# Patient Record
Sex: Female | Born: 1995 | Race: Black or African American | Hispanic: No | Marital: Single | Smoking: Never smoker
Health system: Southern US, Community
[De-identification: ages and names within clinical notes are randomized; demographics above are authoritative.]

## PROBLEM LIST (undated history)

## (undated) DIAGNOSIS — E119 Type 2 diabetes mellitus without complications: Secondary | ICD-10-CM

## (undated) DIAGNOSIS — I10 Essential (primary) hypertension: Secondary | ICD-10-CM

## (undated) DIAGNOSIS — D649 Anemia, unspecified: Secondary | ICD-10-CM

## (undated) DIAGNOSIS — Z789 Other specified health status: Secondary | ICD-10-CM

## (undated) DIAGNOSIS — Z9289 Personal history of other medical treatment: Secondary | ICD-10-CM

## (undated) HISTORY — PX: NO PAST SURGERIES: SHX2092

---

## 2014-05-03 ENCOUNTER — Emergency Department (HOSPITAL_COMMUNITY)
Admission: EM | Admit: 2014-05-03 | Discharge: 2014-05-03 | Discharge status: Against Medical Advice | Payer: PRIVATE HEALTH INSURANCE | Attending: Emergency Medicine | Admitting: Emergency Medicine

## 2014-05-03 ENCOUNTER — Encounter (HOSPITAL_COMMUNITY): Payer: Self-pay | Admitting: Emergency Medicine

## 2014-05-03 ENCOUNTER — Emergency Department (HOSPITAL_COMMUNITY): Payer: PRIVATE HEALTH INSURANCE

## 2014-05-03 DIAGNOSIS — N390 Urinary tract infection, site not specified: Secondary | ICD-10-CM | POA: Diagnosis not present

## 2014-05-03 DIAGNOSIS — D6489 Other specified anemias: Secondary | ICD-10-CM | POA: Insufficient documentation

## 2014-05-03 DIAGNOSIS — R110 Nausea: Secondary | ICD-10-CM | POA: Insufficient documentation

## 2014-05-03 DIAGNOSIS — E669 Obesity, unspecified: Secondary | ICD-10-CM | POA: Diagnosis not present

## 2014-05-03 DIAGNOSIS — N898 Other specified noninflammatory disorders of vagina: Secondary | ICD-10-CM | POA: Diagnosis present

## 2014-05-03 DIAGNOSIS — Z3202 Encounter for pregnancy test, result negative: Secondary | ICD-10-CM | POA: Diagnosis not present

## 2014-05-03 DIAGNOSIS — N939 Abnormal uterine and vaginal bleeding, unspecified: Secondary | ICD-10-CM

## 2014-05-03 LAB — COMPREHENSIVE METABOLIC PANEL
ALT: 14 U/L (ref 0–35)
AST: 15 U/L (ref 0–37)
Albumin: 3.6 g/dL (ref 3.5–5.2)
Alkaline Phosphatase: 87 U/L (ref 39–117)
Anion gap: 12 (ref 5–15)
BILIRUBIN TOTAL: 0.2 mg/dL — AB (ref 0.3–1.2)
BUN: 9 mg/dL (ref 6–23)
CHLORIDE: 103 meq/L (ref 96–112)
CO2: 26 meq/L (ref 19–32)
CREATININE: 1.06 mg/dL (ref 0.50–1.10)
Calcium: 9.4 mg/dL (ref 8.4–10.5)
GFR, EST AFRICAN AMERICAN: 88 mL/min — AB (ref >90)
GFR, EST NON AFRICAN AMERICAN: 76 mL/min — AB (ref >90)
GLUCOSE: 123 mg/dL — AB (ref 70–99)
Potassium: 4.3 mEq/L (ref 3.7–5.3)
Sodium: 141 mEq/L (ref 137–147)
Total Protein: 7.5 g/dL (ref 6.0–8.3)

## 2014-05-03 LAB — URINE MICROSCOPIC-ADD ON

## 2014-05-03 LAB — WET PREP, GENITAL
Clue Cells Wet Prep HPF POC: NONE SEEN
Trich, Wet Prep: NONE SEEN
Yeast Wet Prep HPF POC: NONE SEEN

## 2014-05-03 LAB — CBC WITH DIFFERENTIAL/PLATELET
BASOS ABS: 0 10*3/uL (ref 0.0–0.1)
Basophils Relative: 0 % (ref 0–1)
Eosinophils Absolute: 0 10*3/uL (ref 0.0–0.7)
Eosinophils Relative: 0 % (ref 0–5)
HCT: 32.4 % — ABNORMAL LOW (ref 36.0–46.0)
HEMOGLOBIN: 10 g/dL — AB (ref 12.0–15.0)
LYMPHS PCT: 28 % (ref 12–46)
Lymphs Abs: 2.2 10*3/uL (ref 0.7–4.0)
MCH: 23.8 pg — ABNORMAL LOW (ref 26.0–34.0)
MCHC: 30.9 g/dL (ref 30.0–36.0)
MCV: 77.1 fL — ABNORMAL LOW (ref 78.0–100.0)
Monocytes Absolute: 0.5 10*3/uL (ref 0.1–1.0)
Monocytes Relative: 6 % (ref 3–12)
NEUTROS ABS: 5.1 10*3/uL (ref 1.7–7.7)
Neutrophils Relative %: 66 % (ref 43–77)
Platelets: 468 10*3/uL — ABNORMAL HIGH (ref 150–400)
RBC: 4.2 MIL/uL (ref 3.87–5.11)
RDW: 16.4 % — AB (ref 11.5–15.5)
WBC: 7.8 10*3/uL (ref 4.0–10.5)

## 2014-05-03 LAB — URINALYSIS, ROUTINE W REFLEX MICROSCOPIC
GLUCOSE, UA: NEGATIVE mg/dL
KETONES UR: 15 mg/dL — AB
Nitrite: POSITIVE — AB
PROTEIN: 100 mg/dL — AB
Specific Gravity, Urine: 1.024 (ref 1.005–1.030)
Urobilinogen, UA: 1 mg/dL (ref 0.0–1.0)
pH: 6 (ref 5.0–8.0)

## 2014-05-03 LAB — TYPE AND SCREEN
ABO/RH(D): O POS
Antibody Screen: NEGATIVE

## 2014-05-03 LAB — ABO/RH: ABO/RH(D): O POS

## 2014-05-03 LAB — POC URINE PREG, ED: Preg Test, Ur: NEGATIVE

## 2014-05-03 MED ORDER — CEPHALEXIN 500 MG PO CAPS: ORAL_CAPSULE | ORAL | Status: DC

## 2014-05-03 MED ORDER — DEXTROSE 5 % IV SOLN
1.0000 g | Freq: Once | INTRAVENOUS | Status: AC
  Administered 2014-05-03: 1 g via INTRAVENOUS
  Filled 2014-05-03: qty 10

## 2014-05-03 MED ORDER — ONDANSETRON HCL 4 MG/2ML IJ SOLN: 4.0000 mg | Freq: Once | INTRAMUSCULAR | Status: DC

## 2014-05-03 MED ORDER — HYDROMORPHONE HCL PF 1 MG/ML IJ SOLN: 1.0000 mg | Freq: Once | INTRAMUSCULAR | Status: DC

## 2014-05-03 NOTE — Discharge Instructions (Signed)
Today you left against medical advice and did not receive the pelvic ultrasound. This would rule out dangerous conditions such as ovarian torsion and give [place] a better look at your pelvic organs. Please return to the Emergency Department or to [facility] if your symptoms worsen or you change your mind. Please return to the ED if you become lightheaded, short of breath, or develop any symptom that is concerning to you. These could be signs that your blood count is too low.    Urinary Tract Infection A urinary tract infection (UTI) can occur any place along the urinary tract. The tract includes the kidneys, ureters, bladder, and urethra. A type of germ called bacteria often causes a UTI. UTIs are often helped with antibiotic medicine.  HOME CARE   If given, take antibiotics as told by your doctor. Finish them even if you start to feel better.  Drink enough fluids to keep your pee (urine) clear or pale yellow.  Avoid tea, drinks with caffeine, and bubbly (carbonated) drinks.  Pee often. Avoid holding your pee in for a long time.  Pee before and after having sex (intercourse).  Wipe from front to back after you poop (bowel movement) if you are a woman. Use each tissue only once. GET HELP RIGHT AWAY IF:   You have back pain.  You have lower belly (abdominal) pain.  You have chills.  You feel sick to your stomach (nauseous).  You throw up (vomit).  Your burning or discomfort with peeing does not go away.  You have a fever.  Your symptoms are not better in 3 days. MAKE SURE YOU:   Understand these instructions.  Will watch your condition.  Will get help right away if you are not doing well or get worse. Document Released: 02/03/2008 Document Revised: 05/11/2012 Document Reviewed: 03/17/2012 [facility] Patient Information 2015 [place], [place]. This information is not intended to replace advice given to you by your health care provider. Make sure you discuss any questions you  have with your health care provider.

## 2014-05-03 NOTE — ED Notes (Signed)
Pt c/o vaginal bleeding x 3 weeks; pt sts hx of anemia; pt sts hx of blood transfusion; pt c/o generalized weakness; pt appears pale

## 2014-05-03 NOTE — ED Provider Notes (Signed)
CSN: [id number]     Arrival date & time 05/03/14  1452 History   First MD Initiated Contact with Patient 05/03/14 1729     Chief Complaint  Patient presents with  . Vaginal Bleeding     (Consider location/radiation/quality/duration/timing/severity/associated sxs/prior Treatment) HPI Comments: Patient is a 18 year old female who present sto the ED today for evaluation of vaginal bleeding. She reports that her vaginal bleeding began 3 weeks ago. She has had heavy vaginal bleeding, passing clots every day. She has associated nausea when she is eating. She is not on any form of birth control or estrogen. She additionally has generalized weakness. She reports that this happened in the past, many years ago and she required a blood transfusion. No vomiting, diarrhea, lightheadedness, dizziness, shortness of breath, chest pain. She denies any urinary symptoms including dysuria, urinary urgency, urinary frequency.  Patient is a 18 y.o. female presenting with vaginal bleeding. The history is provided by the patient. No language interpreter was used.  Vaginal Bleeding Associated symptoms: nausea   Associated symptoms: no abdominal pain and no fever     History reviewed. No pertinent past medical history. History reviewed. No pertinent past surgical history. History reviewed. No pertinent family history. History  Substance Use Topics  . Smoking status: Never Smoker   . Smokeless tobacco: Not on file  . Alcohol Use: Yes     Comment: occ   OB History   Grav Para Term Preterm Abortions TAB SAB Ect Mult Living                 Review of Systems  Constitutional: Negative for fever and chills.  Respiratory: Negative for shortness of breath.   Cardiovascular: Negative for chest pain.  Gastrointestinal: Positive for nausea. Negative for vomiting and abdominal pain.  Genitourinary: Positive for vaginal bleeding and pelvic pain.  Neurological: Positive for weakness (generalized) and light-headedness.   All other systems reviewed and are negative.     Allergies  Review of patient's allergies indicates no known allergies.  Home Medications   Prior to Admission medications   Not on File   BP 126/56  Pulse 90  Temp(Src) 97.8 F (36.6 C) (Oral)  Resp 16  SpO2 100% Physical Exam  Nursing note and vitals reviewed. Constitutional: She is oriented to person, place, and time. She appears well-developed and well-nourished. No distress.  Obese No acute distress  HENT:  Head: Normocephalic and atraumatic.  Right Ear: External ear normal.  Left Ear: External ear normal.  Nose: Nose normal.  Mouth/Throat: Oropharynx is clear and moist.  Eyes: Conjunctivae are normal.  Neck: Normal range of motion.  Cardiovascular: Normal rate, regular rhythm and normal heart sounds.   Pulmonary/Chest: Effort normal and breath sounds normal. No stridor. No respiratory distress. She has no wheezes. She has no rales.  Abdominal: Soft. She exhibits no distension. There is tenderness in the suprapubic area. There is no rigidity, no rebound and no guarding.  Genitourinary: There is no rash, tenderness or lesion on the right labia. There is no rash, tenderness or lesion on the left labia. Uterus is tender. Cervix exhibits discharge (bleeding). Cervix exhibits no motion tenderness. Right adnexum displays no mass, no tenderness and no fullness. Left adnexum displays no mass, no tenderness and no fullness. There is tenderness around the vagina. No erythema around the vagina. No foreign body around the vagina. No signs of injury around the vagina. No vaginal discharge found.  Bleeding coming from the cervical os. Patient is  very tender on pelvic exam.  Musculoskeletal: Normal range of motion.  Neurological: She is alert and oriented to person, place, and time. She has normal strength.  Skin: Skin is warm and dry. She is not diaphoretic. No erythema.  Psychiatric: She has a normal mood and affect. Her behavior is  normal.    ED Course  Procedures (including critical care time) Labs Review Labs Reviewed  WET PREP, GENITAL - Abnormal; Notable for the following:    WBC, Wet Prep HPF POC FEW (*)    All other components within normal limits  CBC WITH DIFFERENTIAL - Abnormal; Notable for the following:    Hemoglobin 10.0 (*)    HCT 32.4 (*)    MCV 77.1 (*)    MCH 23.8 (*)    RDW 16.4 (*)    Platelets 468 (*)    All other components within normal limits  COMPREHENSIVE METABOLIC PANEL - Abnormal; Notable for the following:    Glucose, Bld 123 (*)    Total Bilirubin 0.2 (*)    GFR calc non Af Amer 76 (*)    GFR calc Af Amer 88 (*)    All other components within normal limits  URINALYSIS, ROUTINE W REFLEX MICROSCOPIC - Abnormal; Notable for the following:    Color, Urine RED (*)    APPearance TURBID (*)    Hgb urine dipstick LARGE (*)    Bilirubin Urine MODERATE (*)    Ketones, ur 15 (*)    Protein, ur 100 (*)    Nitrite POSITIVE (*)    Leukocytes, UA MODERATE (*)    All other components within normal limits  URINE MICROSCOPIC-ADD ON - Abnormal; Notable for the following:    Casts HYALINE CASTS (*)    All other components within normal limits  GC/CHLAMYDIA PROBE AMP  HIV ANTIBODY (ROUTINE TESTING)  POC URINE PREG, ED  TYPE AND SCREEN  ABO/RH    Imaging Review No results found.   EKG Interpretation None      MDM   Final diagnoses:  Vaginal bleeding  Anemia due to other cause  UTI (lower urinary tract infection)    Patient presents to the emergency department for evaluation of 3 weeks of vaginal bleeding. UA shows infection. Treated in ED with rocephin and given keflex for home. There is blood coming from cervical os on pelvic exam. She is very tender to palpation. Pelvic ultrasound was ordered. The patient initially was receptive to this ultrasound, but then stated that she is unwilling to get the ultrasound because she did not think it would be covered by her insurance. I  explained to the patient that it was very likely that this would in fact be covered by her insurance and the risk of not getting the ultrasound would be missing a life-threatening diagnosis, losing an ovary, permanent morbidity. Patient expresses understanding and still chooses to sign out AGAINST MEDICAL ADVICE. She was encouraged to followup at [facility] tomorrow. Discussed reasons to return to the emergency department immediately. Patient is hemodynamically stable at time of departure. Discussed case with Dr. Manus Gunning who agrees with plan.     Mora Bellman, PA-C 05/04/14 [phone]

## 2014-05-04 LAB — HIV ANTIBODY (ROUTINE TESTING W REFLEX): HIV 1&2 Ab, 4th Generation: NONREACTIVE

## 2014-05-04 LAB — GC/CHLAMYDIA PROBE AMP
CT Probe RNA: NEGATIVE
GC PROBE AMP APTIMA: NEGATIVE

## 2014-05-04 NOTE — ED Provider Notes (Signed)
Medical screening examination/treatment/procedure(s) were performed by non-physician practitioner and as supervising physician I was immediately available for consultation/collaboration. BP 126/56  Pulse 90  Temp(Src) 97.8 F (36.6 C) (Oral)  Resp 16  SpO2 100%   EKG Interpretation None       Glynn Octave, MD 05/04/14 0144

## 2014-06-04 ENCOUNTER — Encounter (HOSPITAL_COMMUNITY): Payer: Self-pay | Admitting: Emergency Medicine

## 2014-06-04 ENCOUNTER — Observation Stay (HOSPITAL_COMMUNITY): Payer: No Typology Code available for payment source

## 2014-06-04 ENCOUNTER — Observation Stay (HOSPITAL_COMMUNITY)
Admission: EM | Admit: 2014-06-04 | Discharge: 2014-06-05 | Disposition: A | Discharge status: Other Acute Inpatient Hospital | Payer: No Typology Code available for payment source | Attending: Obstetrics & Gynecology | Admitting: Obstetrics & Gynecology

## 2014-06-04 DIAGNOSIS — N920 Excessive and frequent menstruation with regular cycle: Secondary | ICD-10-CM | POA: Diagnosis present

## 2014-06-04 DIAGNOSIS — D508 Other iron deficiency anemias: Secondary | ICD-10-CM

## 2014-06-04 DIAGNOSIS — N939 Abnormal uterine and vaginal bleeding, unspecified: Secondary | ICD-10-CM | POA: Diagnosis not present

## 2014-06-04 DIAGNOSIS — Z9289 Personal history of other medical treatment: Secondary | ICD-10-CM | POA: Diagnosis not present

## 2014-06-04 DIAGNOSIS — D649 Anemia, unspecified: Secondary | ICD-10-CM | POA: Diagnosis present

## 2014-06-04 DIAGNOSIS — N921 Excessive and frequent menstruation with irregular cycle: Secondary | ICD-10-CM

## 2014-06-04 HISTORY — DX: Anemia, unspecified: D64.9

## 2014-06-04 HISTORY — DX: Personal history of other medical treatment: Z92.89

## 2014-06-04 HISTORY — DX: Other specified health status: Z78.9

## 2014-06-04 LAB — URINALYSIS, ROUTINE W REFLEX MICROSCOPIC
Bilirubin Urine: NEGATIVE
Glucose, UA: NEGATIVE mg/dL
Ketones, ur: NEGATIVE mg/dL
NITRITE: NEGATIVE
Protein, ur: 100 mg/dL — AB
Specific Gravity, Urine: 1.034 — ABNORMAL HIGH (ref 1.005–1.030)
UROBILINOGEN UA: 1 mg/dL (ref 0.0–1.0)
pH: 6 (ref 5.0–8.0)

## 2014-06-04 LAB — BASIC METABOLIC PANEL
ANION GAP: 13 (ref 5–15)
BUN: 12 mg/dL (ref 6–23)
CO2: 24 mEq/L (ref 19–32)
CREATININE: 0.94 mg/dL (ref 0.50–1.10)
Calcium: 9.6 mg/dL (ref 8.4–10.5)
Chloride: 97 mEq/L (ref 96–112)
GFR calc non Af Amer: 87 mL/min — ABNORMAL LOW (ref >90)
Glucose, Bld: 102 mg/dL — ABNORMAL HIGH (ref 70–99)
POTASSIUM: 3.8 meq/L (ref 3.7–5.3)
Sodium: 134 mEq/L — ABNORMAL LOW (ref 137–147)

## 2014-06-04 LAB — RETICULOCYTES
RBC.: 3.49 MIL/uL — ABNORMAL LOW (ref 3.87–5.11)
Retic Count, Absolute: 111.7 10*3/uL (ref 19.0–186.0)
Retic Ct Pct: 3.2 % — ABNORMAL HIGH (ref 0.4–3.1)

## 2014-06-04 LAB — CBC
HCT: 25.4 % — ABNORMAL LOW (ref 36.0–46.0)
Hemoglobin: 7.2 g/dL — ABNORMAL LOW (ref 12.0–15.0)
MCH: 20.8 pg — ABNORMAL LOW (ref 26.0–34.0)
MCHC: 28.3 g/dL — ABNORMAL LOW (ref 30.0–36.0)
MCV: 73.4 fL — AB (ref 78.0–100.0)
PLATELETS: 338 10*3/uL (ref 150–400)
RBC: 3.46 MIL/uL — ABNORMAL LOW (ref 3.87–5.11)
RDW: 16.8 % — AB (ref 11.5–15.5)
WBC: 7.7 10*3/uL (ref 4.0–10.5)

## 2014-06-04 LAB — PREPARE RBC (CROSSMATCH)

## 2014-06-04 LAB — URINE MICROSCOPIC-ADD ON

## 2014-06-04 LAB — TSH: TSH: 2.58 u[IU]/mL (ref 0.350–4.500)

## 2014-06-04 LAB — PREGNANCY, URINE: Preg Test, Ur: NEGATIVE

## 2014-06-04 LAB — ABO/RH: ABO/RH(D): O POS

## 2014-06-04 MED ORDER — NORETHINDRONE-ETH ESTRADIOL 0.4-35 MG-MCG PO TABS
1.0000 | ORAL_TABLET | Freq: Three times a day (TID) | ORAL | Status: DC
  Filled 2014-06-04 (×3): qty 1

## 2014-06-04 MED ORDER — LACTATED RINGERS IV SOLN: INTRAVENOUS | Status: DC

## 2014-06-04 MED ORDER — ONDANSETRON HCL 4 MG/2ML IJ SOLN: 4.0000 mg | Freq: Three times a day (TID) | INTRAMUSCULAR | Status: AC | PRN

## 2014-06-04 MED ORDER — SODIUM CHLORIDE 0.9 % IV SOLN: Freq: Once | INTRAVENOUS | Status: DC

## 2014-06-04 MED ORDER — IBUPROFEN 600 MG PO TABS: 600.0000 mg | ORAL_TABLET | Freq: Four times a day (QID) | ORAL | Status: DC | PRN

## 2014-06-04 MED ORDER — SIMETHICONE 80 MG PO CHEW
80.0000 mg | CHEWABLE_TABLET | Freq: Four times a day (QID) | ORAL | Status: DC | PRN
  Filled 2014-06-04: qty 1

## 2014-06-04 MED ORDER — TRAMADOL HCL 50 MG PO TABS: 50.0000 mg | ORAL_TABLET | Freq: Four times a day (QID) | ORAL | Status: DC | PRN

## 2014-06-04 NOTE — ED Notes (Signed)
[facility] called for transport. 

## 2014-06-04 NOTE — ED Notes (Signed)
Pt c/o headache and nausea since Friday, has vomited twice since then .

## 2014-06-04 NOTE — ED Notes (Signed)
Despite being educated about the risks of continued bleeding without discovering the source, patient refuses to have a pelvic exam that has been ordered due to her sister's wishes.

## 2014-06-04 NOTE — ED Notes (Signed)
In to start second IV line-patient on cell phone and talking with visitors-did not acknowledge this writer-informed visitors to let me know when patient is off her cell phone so that we may continue her care

## 2014-06-04 NOTE — ED Provider Notes (Signed)
CSN: [id number]     Arrival date & time 06/04/14  1450 History   First MD Initiated Contact with Patient 06/04/14 1756     Chief Complaint  Patient presents with  . Headache  . Nausea     (Consider location/radiation/quality/duration/timing/severity/associated sxs/prior Treatment) HPI Comments: The patient is a 18 year old female with a past medical history of anemia presents emergency room chief complaint of persistent nausea for 4 days. The patient reports onset after eating fish 40s ago, reports 3 episodes of nonbloody emesis. Denies abdominal pain. Patient reports last menstrual period of onset August 2nd.  She reports persistent vaginal bleeding for 2 months. She reports decrease over the last one week, using 3-4 pads per day. Patient reports having to transfuse in the past for a hemoglobin of 4. Reports compliance with iron supplements PCP in [place]  The history is provided by the patient. No language interpreter was used.    History reviewed. No pertinent past medical history. History reviewed. No pertinent past surgical history. No family history on file. History  Substance Use Topics  . Smoking status: Never Smoker   . Smokeless tobacco: Not on file  . Alcohol Use: Yes     Comment: occ   OB History   Grav Para Term Preterm Abortions TAB SAB Ect Mult Living                 Review of Systems  Constitutional: Negative for fever and chills.  Gastrointestinal: Positive for nausea and vomiting. Negative for abdominal pain, diarrhea, constipation and blood in stool.  Genitourinary: Positive for vaginal bleeding.  Neurological: Positive for weakness and headaches. Negative for dizziness, syncope and light-headedness.      Allergies  Shrimp  Home Medications   Prior to Admission medications   Medication Sig Start Date End Date Taking? Authorizing Provider  IRON PO Take 1 tablet by mouth daily.   Yes Historical Provider, MD  albuterol (PROVENTIL  HFA;VENTOLIN HFA) 108 (90 BASE) MCG/ACT inhaler Inhale 2 puffs into the lungs every 6 (six) hours as needed for wheezing or shortness of breath.    Historical Provider, MD   BP 148/82  Pulse 100  Temp(Src) 97.8 F (36.6 C) (Oral)  Resp 16  SpO2 100%  LMP 04/01/2014 Physical Exam  Nursing note and vitals reviewed. Constitutional: She is oriented to person, place, and time. She appears well-developed and well-nourished.  Non-toxic appearance. She does not have a sickly appearance. She does not appear ill. No distress.  HENT:  Head: Normocephalic and atraumatic.  Eyes: EOM are normal. Pupils are equal, round, and reactive to light.  Pale conjuntiva  Neck: Neck supple.  Cardiovascular: Normal rate and regular rhythm.   Pulmonary/Chest: Effort normal and breath sounds normal. She has no wheezes. She has no rales.  Abdominal: Soft. There is no tenderness. There is no rebound.  Musculoskeletal: Normal range of motion.  Neurological: She is alert and oriented to person, place, and time.  Skin: Skin is warm and dry. She is not diaphoretic. No pallor.  Psychiatric: She has a normal mood and affect. Her behavior is normal.    ED Course  Procedures (including critical care time) Labs Review Results for orders placed during the hospital encounter of 06/04/14  CBC      Result Value Ref Range   WBC 7.7  4.0 - 10.5 K/uL   RBC 3.46 (*) 3.87 - 5.11 MIL/uL   Hemoglobin 7.2 (*) 12.0 - 15.0 g/dL   HCT 57.825.4 (*)  36.0 - 46.0 %   MCV 73.4 (*) 78.0 - 100.0 fL   MCH 20.8 (*) 26.0 - 34.0 pg   MCHC 28.3 (*) 30.0 - 36.0 g/dL   RDW 16.1 (*) 09.6 - 04.5 %   Platelets 338  150 - 400 K/uL  BASIC METABOLIC PANEL      Result Value Ref Range   Sodium 134 (*) 137 - 147 mEq/L   Potassium 3.8  3.7 - 5.3 mEq/L   Chloride 97  96 - 112 mEq/L   CO2 24  19 - 32 mEq/L   Glucose, Bld 102 (*) 70 - 99 mg/dL   BUN 12  6 - 23 mg/dL   Creatinine, Ser 4.09  0.50 - 1.10 mg/dL   Calcium 9.6  8.4 - 81.1 mg/dL   GFR calc  non Af Amer 87 (*) >90 mL/min   GFR calc Af Amer >90  >90 mL/min   Anion gap 13  5 - 15  URINALYSIS, ROUTINE W REFLEX MICROSCOPIC      Result Value Ref Range   Color, Urine AMBER (*) YELLOW   APPearance CLOUDY (*) CLEAR   Specific Gravity, Urine 1.034 (*) 1.005 - 1.030   pH 6.0  5.0 - 8.0   Glucose, UA NEGATIVE  NEGATIVE mg/dL   Hgb urine dipstick LARGE (*) NEGATIVE   Bilirubin Urine NEGATIVE  NEGATIVE   Ketones, ur NEGATIVE  NEGATIVE mg/dL   Protein, ur 914 (*) NEGATIVE mg/dL   Urobilinogen, UA 1.0  0.0 - 1.0 mg/dL   Nitrite NEGATIVE  NEGATIVE   Leukocytes, UA TRACE (*) NEGATIVE  PREGNANCY, URINE      Result Value Ref Range   Preg Test, Ur NEGATIVE  NEGATIVE  URINE MICROSCOPIC-ADD ON      Result Value Ref Range   Squamous Epithelial / LPF FEW (*) RARE   WBC, UA 3-6  <3 WBC/hpf   RBC / HPF 3-6  <3 RBC/hpf   Bacteria, UA MANY (*) RARE   Urine-Other MUCOUS PRESENT    RETICULOCYTES      Result Value Ref Range   Retic Ct Pct 3.2 (*) 0.4 - 3.1 %   RBC. 3.49 (*) 3.87 - 5.11 MIL/uL   Retic Count, Manual 111.7  19.0 - 186.0 K/uL  TSH      Result Value Ref Range   TSH 2.580  0.350 - 4.500 uIU/mL  TYPE AND SCREEN      Result Value Ref Range   ABO/RH(D) O POS     Antibody Screen NEG     Sample Expiration 06/07/2014     Unit Number [id number]     Blood Component Type RED CELLS,LR     Unit division 00     Status of Unit ALLOCATED     Transfusion Status OK TO TRANSFUSE     Crossmatch Result Compatible     Unit Number [id number]     Blood Component Type RBC LR PHER2     Unit division 00     Status of Unit ISSUED     Transfusion Status OK TO TRANSFUSE     Crossmatch Result Compatible    ABO/RH      Result Value Ref Range   ABO/RH(D) O POS    PREPARE RBC (CROSSMATCH)      Result Value Ref Range   Order Confirmation ORDER PROCESSED BY BLOOD BANK     No results found.  Imaging Review No results found.   EKG Interpretation None  MDM   Final diagnoses:   Other iron deficiency anemias  Menorrhagia with irregular cycle   Patient presents with persistent vaginal bleeding since August 2, multiple pads per day. Hemoglobin 7.2 today, down trending from 10 4 weeks ago. Nausea, vomiting, headache likely due to anemia. Plan to transfuse and admit. Positive orthostatics. Discussed with Penne Lash who advises, Pelvic US, ortho tri-cyclen, pt does not have history or family history of PE/DVT. Discussed pelvic exam and plan to admit to women's health.  Pt has positive ortho statics. Patient declined pelvic exam, pelvic ultrasound. Plant to admit to Dr. Penne Lash service.  Meds given in ED:  Medications  0.9 %  sodium chloride infusion (not administered)  simethicone (MYLICON) chewable tablet 80 mg (not administered)  lactated ringers infusion (not administered)  traMADol (ULTRAM) tablet 50 mg (not administered)  ibuprofen (ADVIL,MOTRIN) tablet 600 mg (not administered)  norethindrone-ethinyl estradiol (OVCON-35,BALZIVA,BRIELLYN) tablet 1 tablet (not administered)    New Prescriptions   No medications on file    Mellody Drown, PA-C 06/05/14 0210

## 2014-06-04 NOTE — ED Notes (Signed)
Pt is refusing the pelvic exam and the ultrasounds at this time. Dr. Littie DeedsGentry is at bedside speaking with patient at this time.

## 2014-06-04 NOTE — ED Notes (Signed)
PA at bedside.

## 2014-06-05 ENCOUNTER — Inpatient Hospital Stay (HOSPITAL_COMMUNITY): Payer: No Typology Code available for payment source

## 2014-06-05 ENCOUNTER — Encounter (HOSPITAL_COMMUNITY): Payer: Self-pay | Admitting: *Deleted

## 2014-06-05 DIAGNOSIS — Z9289 Personal history of other medical treatment: Secondary | ICD-10-CM

## 2014-06-05 DIAGNOSIS — N939 Abnormal uterine and vaginal bleeding, unspecified: Secondary | ICD-10-CM

## 2014-06-05 DIAGNOSIS — D649 Anemia, unspecified: Secondary | ICD-10-CM

## 2014-06-05 LAB — CBC
HCT: 25.5 % — ABNORMAL LOW (ref 36.0–46.0)
HEMOGLOBIN: 7.7 g/dL — AB (ref 12.0–15.0)
MCH: 22.6 pg — ABNORMAL LOW (ref 26.0–34.0)
MCHC: 30.2 g/dL (ref 30.0–36.0)
MCV: 75 fL — ABNORMAL LOW (ref 78.0–100.0)
PLATELETS: 277 10*3/uL (ref 150–400)
RBC: 3.4 MIL/uL — ABNORMAL LOW (ref 3.87–5.11)
RDW: 18.4 % — ABNORMAL HIGH (ref 11.5–15.5)
WBC: 8.3 10*3/uL (ref 4.0–10.5)

## 2014-06-05 LAB — IRON AND TIBC
Iron: <10 ug/dL — ABNORMAL LOW (ref 42–135)
UIBC: 479 ug/dL — ABNORMAL HIGH (ref 125–400)

## 2014-06-05 LAB — PREPARE RBC (CROSSMATCH)

## 2014-06-05 LAB — FERRITIN: Ferritin: 2 ng/mL — ABNORMAL LOW (ref 10–291)

## 2014-06-05 LAB — VITAMIN B12: Vitamin B-12: 350 pg/mL (ref 211–911)

## 2014-06-05 LAB — FOLATE: FOLATE: 8.3 ng/mL

## 2014-06-05 LAB — ABO/RH: ABO/RH(D): O POS

## 2014-06-05 MED ORDER — FERROUS SULFATE 325 (65 FE) MG PO TABS: 325.0000 mg | ORAL_TABLET | Freq: Three times a day (TID) | ORAL | Status: DC

## 2014-06-05 MED ORDER — SODIUM CHLORIDE 0.9 % IV SOLN
Freq: Once | INTRAVENOUS | Status: AC
  Administered 2014-06-05: 10 mL/h via INTRAVENOUS

## 2014-06-05 MED ORDER — NORETHIN-ETH ESTRADIOL-FE 0.4-35 MG-MCG PO CHEW: 1.0000 | CHEWABLE_TABLET | Freq: Every day | ORAL | Status: DC

## 2014-06-05 MED ORDER — NORETHIN-ETH ESTRADIOL-FE 0.4-35 MG-MCG PO CHEW
1.0000 | CHEWABLE_TABLET | Freq: Three times a day (TID) | ORAL | Status: DC
  Administered 2014-06-05 (×3): 1 via ORAL
  Filled 2014-06-05 (×5): qty 1

## 2014-06-05 MED ORDER — FERROUS SULFATE 325 (65 FE) MG PO TABS
325.0000 mg | ORAL_TABLET | Freq: Three times a day (TID) | ORAL | Status: DC
  Administered 2014-06-05 (×3): 325 mg via ORAL
  Filled 2014-06-05 (×3): qty 1

## 2014-06-05 NOTE — Discharge Summary (Signed)
Physician Discharge Summary  Patient ID: Rebecca Coleman MRN: [id number] DOB/AGE: [birth year] 18 y.o.  Admit date: 06/04/2014 Discharge date: 06/05/2014  Admission Diagnoses:menorrhagia and anemia  Discharge Diagnoses: same Active Problems:   Anemia   Menorrhagia   Discharged Condition: good  Hospital Course: 18 y.o. G0P0 Patient's last menstrual period was 04/01/2014. Patient presented with prolonged vaginal bleeding and symptoms of anemia. She has a history of DUB  Past Medical History  Diagnosis Date  . Anemia   . History of blood transfusion   . Medical history non-contributory    Past Surgical History  Procedure Laterality Date  . No past surgeries     Allergies  Allergen Reactions  . Shrimp [Shellfish Allergy] Other (See Comments)    Itchy throat     Consults: None  Significant Diagnostic Studies: labs:  CBC    Component Value Date/Time   WBC 7.9 06/05/2014 1527   RBC 4.60 06/05/2014 1527   RBC 3.49* 06/04/2014 1851   HGB 11.0* 06/05/2014 1527   HCT 35.3* 06/05/2014 1527   PLT 252 06/05/2014 1527   MCV 76.7* 06/05/2014 1527   MCH 23.9* 06/05/2014 1527   MCHC 31.2 06/05/2014 1527   RDW 17.7* 06/05/2014 1527   LYMPHSABS 2.2 05/03/2014 1512   MONOABS 0.5 05/03/2014 1512   EOSABS 0.0 05/03/2014 1512   BASOSABS 0.0 05/03/2014 1512      Treatments: transfusion 3 units PRBC, IV fluids, OCP  Discharge Exam: Blood pressure 121/72, pulse 88, temperature 99.5 F (37.5 C), temperature source Oral, resp. rate 18, height 6' (1.829 m), weight 119.296 kg (263 lb), last menstrual period 04/01/2014, SpO2 100.00%. General appearance: alert, cooperative and no distress GI: soft, non-tender; bowel sounds normal; no masses,  no organomegaly  Disposition: discharge home    Medication List    TAKE these medications       albuterol 108 (90 BASE) MCG/ACT inhaler  Commonly known as:  PROVENTIL HFA;VENTOLIN HFA  Inhale 2 puffs into the lungs every 6 (six) hours as needed for  wheezing or shortness of breath.     ferrous sulfate 325 (65 FE) MG tablet  Take 1 tablet (325 mg total) by mouth 3 (three) times daily with meals.     Norethin-Eth Estradiol-Fe 0.4-35 MG-MCG tablet  Commonly known as:  FEMCON FE,WYMZYA FE,ZENCHENT FE,ZEOSA  Chew 1 tablet by mouth daily.      ASK your doctor about these medications       IRON PO  Take 1 tablet by mouth daily.           Follow-up Information   Follow up with [facility] In 3 weeks.   Contact information:   [address] [place] [address] [phone]       Signed: Scheryl DarterRNOLD,JAMES 06/05/2014, 7:14 PM

## 2014-06-05 NOTE — Progress Notes (Signed)
Admission nutrition screen triggered for MST of 3. However there has not been an  unintentional weight loss > 10 lbs within the last month. . Patients chart reviewed and assessed  for nutritional risk. Patient is determined to be at low nutrition  risk.   Elisabeth CaraKatherine Kerman Pfost M.Odis LusterEd. R.D. LDN Neonatal Nutrition Support Specialist/RD III Pager [phone]

## 2014-06-05 NOTE — H&P (Signed)
FACULTY PRACTICE ANTEPARTUM ADMISSION HISTORY AND PHYSICAL NOTE   History of Present Illness: Rebecca Coleman is a 18 y.o. G0P0 admitted for menorrhagia leading to symptomatic anemia.    Pt reports h/o abnormal menses "for her entire life." States that when she was in 9th grade, she had to get blood tranfsion given HgB ~ 4. She has since been taking iron pills occasionally, but reports she has continued with irrgeular periods.  Reports periods come sporadically, every 2-3 months, but will sometimes last 1 wk and sometimes 1 month. Passes clots regularly. Goes through ~ 3 - 4 pads a day.  Most recently, vaginal bleeding began ~ 1 mo ago. She was seen in ED, where she was diagnosed with UTI, but left AMA before gettig TVUS. Cervical exam at this point showed bleeding from cervical os. She re-presented to ER this evening with ongoing bleeding and dizziness, SOB, and N/V and was found to have HgB of 7.2 She refused vaginal exam and TVUS, but did agree to admission and blood transusion given failed outpatient management of menorrhagia.   At [facility] got 1 u pRBC.  Patient Active Problem List   Diagnosis Date Noted  . Anemia 06/04/2014  . Menorrhagia 06/04/2014     Past Medical History  Diagnosis Date  . Anemia   . History of blood transfusion   . Medical history non-contributory      Past Surgical History  Procedure Laterality Date  . No past surgeries       OB History   Grav Para Term Preterm Abortions TAB SAB Ect Mult Living   0               History   Social History  . Marital Status: Single    Spouse Name: N/A    Number of Children: N/A  . Years of Education: N/A   Social History Main Topics  . Smoking status: Never Smoker   . Smokeless tobacco: Never Used  . Alcohol Use: Yes  . Drug Use: No  . Sexual Activity: Yes    Birth Control/ Protection: None   Other Topics Concern  . None   Social History Narrative  . None    Family History  Problem Relation  Age of Onset  . Hypertension Sister   . Diabetes Maternal Aunt   . Cancer Mother   . Mental retardation Cousin     Allergies  Allergen Reactions  . Shrimp [Shellfish Allergy] Other (See Comments)    Itchy throat    Prescriptions prior to admission  Medication Sig Dispense Refill  . IRON PO Take 1 tablet by mouth daily.      Marland Kitchen albuterol (PROVENTIL HFA;VENTOLIN HFA) 108 (90 BASE) MCG/ACT inhaler Inhale 2 puffs into the lungs every 6 (six) hours as needed for wheezing or shortness of breath.        . sodium chloride   Intravenous Once  . sodium chloride   Intravenous Once  . ferrous sulfate  325 mg Oral TID WC  . Norethin-Eth Estradiol-Fe  1 tablet Oral Q8H    Review of Systems - + nausea, + vomiting, + dizziness. Denies SOB, chest pain. Denies hirsuitism.  Vitals:  BP 137/52  Pulse 99  Temp(Src) 98.1 F (36.7 C) (Oral)  Resp 18  Ht 6' (1.829 m)  Wt 263 lb (119.296 kg)  BMI 35.66 kg/m2  SpO2 93%  LMP 04/01/2014 Physical Examination:  General appearance - alert, well appearing, and in no distress; no evidence of  hirsuitism. Obese. Abdomen: tender diffusely, but no rebound, gaurding or palpable masses Lungs: CTAB Cardiac: RRR, no m/r/g Pelvic Exam:Adamently declined Extremities: extremities normal, atraumatic, no cyanosis or edema   Labs:  Results for orders placed during the hospital encounter of 06/04/14 (from the past 24 hour(s))  CBC   Collection Time    06/04/14  3:35 PM      Result Value Ref Range   WBC 7.7  4.0 - 10.5 K/uL   RBC 3.46 (*) 3.87 - 5.11 MIL/uL   Hemoglobin 7.2 (*) 12.0 - 15.0 g/dL   HCT 40.9 (*) 81.1 - 91.4 %   MCV 73.4 (*) 78.0 - 100.0 fL   MCH 20.8 (*) 26.0 - 34.0 pg   MCHC 28.3 (*) 30.0 - 36.0 g/dL   RDW 78.2 (*) 95.6 - 21.3 %   Platelets 338  150 - 400 K/uL  BASIC METABOLIC PANEL   Collection Time    06/04/14  3:35 PM      Result Value Ref Range   Sodium 134 (*) 137 - 147 mEq/L   Potassium 3.8  3.7 - 5.3 mEq/L   Chloride 97  96 -  112 mEq/L   CO2 24  19 - 32 mEq/L   Glucose, Bld 102 (*) 70 - 99 mg/dL   BUN 12  6 - 23 mg/dL   Creatinine, Ser 0.86  0.50 - 1.10 mg/dL   Calcium 9.6  8.4 - 57.8 mg/dL   GFR calc non Af Amer 87 (*) >90 mL/min   GFR calc Af Amer >90  >90 mL/min   Anion gap 13  5 - 15  URINALYSIS, ROUTINE W REFLEX MICROSCOPIC   Collection Time    06/04/14  3:44 PM      Result Value Ref Range   Color, Urine AMBER (*) YELLOW   APPearance CLOUDY (*) CLEAR   Specific Gravity, Urine 1.034 (*) 1.005 - 1.030   pH 6.0  5.0 - 8.0   Glucose, UA NEGATIVE  NEGATIVE mg/dL   Hgb urine dipstick LARGE (*) NEGATIVE   Bilirubin Urine NEGATIVE  NEGATIVE   Ketones, ur NEGATIVE  NEGATIVE mg/dL   Protein, ur 469 (*) NEGATIVE mg/dL   Urobilinogen, UA 1.0  0.0 - 1.0 mg/dL   Nitrite NEGATIVE  NEGATIVE   Leukocytes, UA TRACE (*) NEGATIVE  PREGNANCY, URINE   Collection Time    06/04/14  3:44 PM      Result Value Ref Range   Preg Test, Ur NEGATIVE  NEGATIVE  URINE MICROSCOPIC-ADD ON   Collection Time    06/04/14  3:44 PM      Result Value Ref Range   Squamous Epithelial / LPF FEW (*) RARE   WBC, UA 3-6  <3 WBC/hpf   RBC / HPF 3-6  <3 RBC/hpf   Bacteria, UA MANY (*) RARE   Urine-Other MUCOUS PRESENT    VITAMIN B12   Collection Time    06/04/14  6:51 PM      Result Value Ref Range   Vitamin B-12 350  211 - 911 pg/mL  FOLATE   Collection Time    06/04/14  6:51 PM      Result Value Ref Range   Folate 8.3    IRON AND TIBC   Collection Time    06/04/14  6:51 PM      Result Value Ref Range   Iron <10 (*) 42 - 135 ug/dL   TIBC Not calculated due to Iron <10.  250 -  470 ug/dL   Saturation Ratios Not calculated due to Iron <10.  20 - 55 %   UIBC 479 (*) 125 - 400 ug/dL  FERRITIN   Collection Time    06/04/14  6:51 PM      Result Value Ref Range   Ferritin 2 (*) 10 - 291 ng/mL  RETICULOCYTES   Collection Time    06/04/14  6:51 PM      Result Value Ref Range   Retic Ct Pct 3.2 (*) 0.4 - 3.1 %   RBC. 3.49 (*)  3.87 - 5.11 MIL/uL   Retic Count, Manual 111.7  19.0 - 186.0 K/uL  TSH   Collection Time    06/04/14  6:51 PM      Result Value Ref Range   TSH 2.580  0.350 - 4.500 uIU/mL  TYPE AND SCREEN   Collection Time    06/04/14  6:51 PM      Result Value Ref Range   ABO/RH(D) O POS     Antibody Screen NEG     Sample Expiration 06/07/2014     Unit Number [id number]     Blood Component Type RED CELLS,LR     Unit division 00     Status of Unit ALLOCATED     Transfusion Status OK TO TRANSFUSE     Crossmatch Result Compatible     Unit Number [id number]     Blood Component Type RBC LR PHER2     Unit division 00     Status of Unit ISSUED     Transfusion Status OK TO TRANSFUSE     Crossmatch Result Compatible    ABO/RH   Collection Time    06/04/14  6:51 PM      Result Value Ref Range   ABO/RH(D) O POS    PREPARE RBC (CROSSMATCH)   Collection Time    06/04/14  6:54 PM      Result Value Ref Range   Order Confirmation ORDER PROCESSED BY BLOOD BANK      Imaging Studies: No results found.   Assessment and Plan: Patient Active Problem List   Diagnosis Date Noted  . Anemia 06/04/2014  . Menorrhagia 06/04/2014   18 yo G0P0 with h/o menorrhagia presents with heavy vaginal bleeding with symptomatic anemia, failing outpatient management.  #) Menorrhagia - H/o menorrhagia and irregular period since menses began. Need for one prior blood transfusion. Concern given age for potential hematologic d/o, but also on ddx is anovulatory status. Less concern for PCOS given lack of hirsuitism and glucose intolerance, although pt is obese. Also on ddx is structural abnormality, such as fibroid uterus or polyp, although less likely given chronicity of problem - s/p 1 u pRBC at OSH. Will dose 2 more units - irons studies c/w iron deficiency anemia. Will start iron TID - B12, folate, reticulocyte count, TSH WNL - Will need heme/onc w/up as outpatient - Pt adamanetly refused speculum exam or TVUS  both in prior ER visits, OSH, and again in house. Is planning on pursuing this [place]upw [place] and [place]t for 10/12 - Start OCP taper: Today to dose 1 pill q 8 hours and to continue for 3 days and taper down accordingly - Zofran prn nausea - tramadol prn pain - AM CBC  F - none needed, getting pRBC E - replete prn N - reg diet GI - none needed PPx - none needed, early ambulation  Anticipate d/c home tomorrow after 3 u  pRBC and improving HgB  Ethelda Chick, MD OB fellow Faculty Practice, [facility]

## 2014-06-05 NOTE — ED Provider Notes (Signed)
Medical screening examination/treatment/procedure(s) were conducted as a shared visit with non-physician practitioner(s) and myself.  I personally evaluated the patient during the encounter.   EKG Interpretation None      CRITICAL CARE Performed by: Mirian MoGentry, Makenzy Krist   Total critical care time: 30  Critical care time was exclusive of separately billable procedures and treating other patients.  Critical care was necessary to treat or prevent imminent or life-threatening deterioration.  Critical care was time spent personally by me on the following activities: development of treatment plan with patient and/or surrogate as well as nursing, discussions with consultants, evaluation of patient's response to treatment, examination of patient, obtaining history from patient or surrogate, ordering and performing treatments and interventions, ordering and review of laboratory studies, ordering and review of radiographic studies, pulse oximetry and re-evaluation of patient's condition.   Briefly, pt is a 18 y.o. female presenting with vaginal bleeding and anemia, failed outpt therapy.  I performed an examination on the patient including cardiac, pulmonary, and gi systems which were unremarkable.  Additionally I spent a significant amount of time with the patient and she refused transvaginal ultrasound. Reason for this was due to social reasons and was unable to convince the patient's family member to have the patient obtain ultrasound. I explained the risks to the patient and the family member, they continued to refuse.  Patient was transferred to women's for admission after receiving blood.     Mirian MoMatthew Jeriah Corkum, MD 06/05/14 [phone]

## 2014-06-05 NOTE — Discharge Instructions (Signed)

## 2014-06-05 NOTE — Progress Notes (Signed)
Discharged.  Saline locks removed, tolerated well.  Discharged instructions reviewed with patient, patient verbalized understanding.

## 2014-06-05 NOTE — Progress Notes (Signed)
Discharged, ambulatory to car with NT.  Discharged with friends.  Denies any pain or discomfort.

## 2014-06-06 LAB — CBC
HEMATOCRIT: 35.3 % — AB (ref 36.0–46.0)
Hemoglobin: 11 g/dL — ABNORMAL LOW (ref 12.0–15.0)
MCH: 23.9 pg — ABNORMAL LOW (ref 26.0–34.0)
MCHC: 31.2 g/dL (ref 30.0–36.0)
MCV: 76.7 fL — ABNORMAL LOW (ref 78.0–100.0)
Platelets: 252 10*3/uL (ref 150–400)
RBC: 4.6 MIL/uL (ref 3.87–5.11)
RDW: 17.7 % — AB (ref 11.5–15.5)
WBC: 7.9 10*3/uL (ref 4.0–10.5)

## 2014-06-06 LAB — URINE CULTURE

## 2014-06-06 LAB — TYPE AND SCREEN
ABO/RH(D): O POS
Antibody Screen: NEGATIVE
UNIT DIVISION: 0
Unit division: 0

## 2014-06-07 ENCOUNTER — Other Ambulatory Visit: Payer: Self-pay | Admitting: Obstetrics & Gynecology

## 2014-06-07 LAB — TYPE AND SCREEN
ABO/RH(D): O POS
Antibody Screen: NEGATIVE
Unit division: 0
Unit division: 0

## 2014-06-07 MED ORDER — AMOXICILLIN 500 MG PO CAPS: 500.0000 mg | ORAL_CAPSULE | Freq: Three times a day (TID) | ORAL | Status: DC

## 2014-06-07 NOTE — ED Provider Notes (Signed)
Medical screening examination/treatment/procedure(s) were conducted as a shared visit with non-physician practitioner(s) and myself.  I personally evaluated the patient during the encounter.   EKG Interpretation None       Briefly, pt is a 18 y.o. female presenting with generalized weakness and chronic vaginal bleeding.  I performed an examination on the patient including cardiac, pulmonary, and gi systems which were unremarkable.  Labs with anemia, which is likely etiology of symptoms.  Discussed wu, but there are significant concern for cost, pt refused pelvic us, was given risks of refusing so including disability and death and continued to refuse.  Transferred to women's.     Mirian MoMatthew Miyoko Hashimi, MD 06/07/14 [phone]

## 2014-06-07 NOTE — Progress Notes (Signed)
Called Rebecca Coleman and notified her of urine culture resulting showing UTI and needs to take amoxicillin which was sent to her pharmacy. [name] voiced understanding.

## 2014-06-07 NOTE — H&P (Signed)
Attestation of Attending Supervision of Fellow: Evaluation and management procedures were performed by the Fellow under my supervision and collaboration.  I have reviewed the Fellow's note and chart, and I agree with the management and plan.    

## 2014-06-07 NOTE — Progress Notes (Signed)
Patient has UTI.  Antibiotics prescribed.  RN to call.

## 2014-12-10 ENCOUNTER — Emergency Department (HOSPITAL_COMMUNITY)
Admission: EM | Admit: 2014-12-10 | Discharge: 2014-12-10 | Disposition: A | Discharge status: Home / Self Care | Payer: No Typology Code available for payment source | Attending: Emergency Medicine | Admitting: Emergency Medicine

## 2014-12-10 ENCOUNTER — Encounter (HOSPITAL_COMMUNITY): Payer: Self-pay | Admitting: Emergency Medicine

## 2014-12-10 DIAGNOSIS — F419 Anxiety disorder, unspecified: Secondary | ICD-10-CM | POA: Diagnosis not present

## 2014-12-10 DIAGNOSIS — Z792 Long term (current) use of antibiotics: Secondary | ICD-10-CM | POA: Diagnosis not present

## 2014-12-10 DIAGNOSIS — Z793 Long term (current) use of hormonal contraceptives: Secondary | ICD-10-CM | POA: Insufficient documentation

## 2014-12-10 DIAGNOSIS — R079 Chest pain, unspecified: Secondary | ICD-10-CM | POA: Diagnosis not present

## 2014-12-10 DIAGNOSIS — D649 Anemia, unspecified: Secondary | ICD-10-CM | POA: Insufficient documentation

## 2014-12-10 DIAGNOSIS — Z79899 Other long term (current) drug therapy: Secondary | ICD-10-CM | POA: Diagnosis not present

## 2014-12-10 LAB — BASIC METABOLIC PANEL
ANION GAP: 9 (ref 5–15)
BUN: 10 mg/dL (ref 6–23)
CALCIUM: 9.2 mg/dL (ref 8.4–10.5)
CHLORIDE: 101 mmol/L (ref 96–112)
CO2: 28 mmol/L (ref 19–32)
CREATININE: 0.87 mg/dL (ref 0.50–1.10)
GFR calc Af Amer: >90 mL/min (ref >90)
Glucose, Bld: 137 mg/dL — ABNORMAL HIGH (ref 70–99)
Potassium: 4.1 mmol/L (ref 3.5–5.1)
Sodium: 138 mmol/L (ref 135–145)

## 2014-12-10 LAB — CBC
HEMATOCRIT: 37.6 % (ref 36.0–46.0)
HEMOGLOBIN: 11 g/dL — AB (ref 12.0–15.0)
MCH: 23.2 pg — AB (ref 26.0–34.0)
MCHC: 29.3 g/dL — ABNORMAL LOW (ref 30.0–36.0)
MCV: 79.3 fL (ref 78.0–100.0)
Platelets: 434 10*3/uL — ABNORMAL HIGH (ref 150–400)
RBC: 4.74 MIL/uL (ref 3.87–5.11)
RDW: 18.7 % — ABNORMAL HIGH (ref 11.5–15.5)
WBC: 4.7 10*3/uL (ref 4.0–10.5)

## 2014-12-10 LAB — I-STAT TROPONIN, ED: Troponin i, poc: 0 ng/mL (ref 0.00–0.08)

## 2014-12-10 NOTE — Discharge Instructions (Signed)
Chest Wall Pain Chest wall pain is pain in or around the bones and muscles of your chest. It may take up to 6 weeks to get better. It may take longer if you must stay physically active in your work and activities.  CAUSES  Chest wall pain may happen on its own. However, it may be caused by:  A viral illness like the flu.  Injury.  Coughing.  Exercise.  Arthritis.  Fibromyalgia.  Shingles. HOME CARE INSTRUCTIONS   Avoid overtiring physical activity. Try not to strain or perform activities that cause pain. This includes any activities using your chest or your abdominal and side muscles, especially if heavy weights are used.  Put ice on the sore area.  Put ice in a plastic bag.  Place a towel between your skin and the bag.  Leave the ice on for 15-20 minutes per hour while awake for the first 2 days.  Only take over-the-counter or prescription medicines for pain, discomfort, or fever as directed by your caregiver. SEEK IMMEDIATE MEDICAL CARE IF:   Your pain increases, or you are very uncomfortable.  You have a fever.  Your chest pain becomes worse.  You have new, unexplained symptoms.  You have nausea or vomiting.  You feel sweaty or lightheaded.  You have a cough with phlegm (sputum), or you cough up blood. MAKE SURE YOU:   Understand these instructions.  Will watch your condition.  Will get help right away if you are not doing well or get worse. Document Released: 08/17/2005 Document Revised: 11/09/2011 Document Reviewed: 04/13/2011 [facility] Patient Information 2015 [employer] [place]. This information is not intended to replace advice given to you by your health care provider. Make sure you discuss any questions you have with your health care provider.  Your blood glucose today is 138.  You are slightly anemic and your hemoglobin is 11.0.  Please follow up with your student health department.

## 2014-12-10 NOTE — ED Provider Notes (Signed)
Present with right sided anterior chest pain onset yesterday, constant. Denies shortness of breath. Pain is worse with moving her right arm improved with remaining still. No treatment prior to coming here. Discomfort mild at present.  she's had similar pain in the past which she attributed to "anxiety" on exam no distress lungs clear auscultation heart regular rate and rhythm no murmurs or rubs abdomen obese nontender extremities without edema chest is exquisitely tender right parasternal area reproducing pain exactly. Pain is also reproduced by forcible abduction of right shoulder  Exam and history consistent with chest wall pain  PT isalso noted to be mildly hyperglycemic   Doug SouSam Cassady Stanczak, MD 12/10/14 2352

## 2014-12-10 NOTE — ED Notes (Signed)
Pt c/o recurrent intermittent chest pain, this episode since yesterday morning. Pt sts "I usually call it my anxiety but this episode has lasted longer than usual." Pt A&Ox4. NAD noted. Pt c/o SOB at times but not currently. Pain is described as a tightness in her central chest. Pt does not take any medication for anxiety. Denies N/V, radiating pain.

## 2014-12-10 NOTE — ED Provider Notes (Signed)
CSN: [id number]     Arrival date & time 12/10/14  1215 History   First MD Initiated Contact with Patient 12/10/14 1501     Chief Complaint  Patient presents with  . Chest Pain  . Anxiety     (Consider location/radiation/quality/duration/timing/severity/associated sxs/prior Treatment) Patient is a 19 y.o. female presenting with chest pain and anxiety. The history is provided by the patient. No language interpreter was used.  Chest Pain Associated symptoms: anxiety   Anxiety Associated symptoms include chest pain.  Rebecca Coleman is a 19 y.o black female who presents for new onset intermittent right sided chest pain that began yesterday while sitting. Moving her right arm and pressing over the right side of her chest makes it worse.  She states she has had this several times in the past but it went away on its own. She has not taken anything for pain. She states it is related to her anxiety and worrying about things.  She states she was searching on the internet which worried her. Her LMP was 1 week ago.  She denies any fever, chills, sore throat, cough, shortness of breath, abdominal pain, nausea, vomiting, or calf tenderness.   Past Medical History  Diagnosis Date  . Anemia   . History of blood transfusion   . Medical history non-contributory    Past Surgical History  Procedure Laterality Date  . No past surgeries     Family History  Problem Relation Age of Onset  . Hypertension Sister   . Diabetes Maternal Aunt   . Cancer Mother   . Mental retardation Cousin    History  Substance Use Topics  . Smoking status: Never Smoker   . Smokeless tobacco: Never Used  . Alcohol Use: Yes   OB History    Gravida Para Term Preterm AB TAB SAB Ectopic Multiple Living   0              Review of Systems  Cardiovascular: Positive for chest pain.  All other systems reviewed and are negative.     Allergies  Shrimp  Home Medications   Prior to Admission medications   Medication Sig  Start Date End Date Taking? Authorizing Provider  albuterol (PROVENTIL HFA;VENTOLIN HFA) 108 (90 BASE) MCG/ACT inhaler Inhale 2 puffs into the lungs every 6 (six) hours as needed for wheezing or shortness of breath.   Yes Historical Provider, MD  ferrous sulfate 325 (65 FE) MG tablet Take 1 tablet (325 mg total) by mouth 3 (three) times daily with meals. 06/05/14  Yes Adam Phenix, MD  Norethin-Eth Estradiol-Fe [facility] FE,WYMZYA Cecille Amsterdam) 0.4-35 MG-MCG tablet Chew 1 tablet by mouth daily. 06/05/14  Yes Adam Phenix, MD  amoxicillin (AMOXIL) 500 MG capsule Take 1 capsule (500 mg total) by mouth 3 (three) times daily. 06/07/14   Lesly Dukes, MD   BP 128/76 mmHg  Pulse 85  Temp(Src) 98.4 F (36.9 C) (Oral)  Resp 16  SpO2 100%  LMP 11/12/2014 Physical Exam  Constitutional: She is oriented to person, place, and time. She appears well-developed and well-nourished.  HENT:  Head: Normocephalic and atraumatic.  Eyes: Conjunctivae and EOM are normal.  Neck: Normal range of motion. Neck supple.  Cardiovascular: Normal rate, regular rhythm and normal heart sounds.   Pulmonary/Chest: Effort normal and breath sounds normal. No respiratory distress. She has no wheezes.  Reproducible right sided chest tenderness to palpation.  No increased pain with movement of right arm.   Abdominal: Soft. There  is no tenderness.  Musculoskeletal: Normal range of motion.  Neurological: She is alert and oriented to person, place, and time.  Skin: Skin is warm and dry.  Nursing note and vitals reviewed.   ED Course  Procedures (including critical care time) Labs Review Labs Reviewed  CBC - Abnormal; Notable for the following:    Hemoglobin 11.0 (*)    MCH 23.2 (*)    MCHC 29.3 (*)    RDW 18.7 (*)    Platelets 434 (*)    All other components within normal limits  BASIC METABOLIC PANEL - Abnormal; Notable for the following:    Glucose, Bld 137 (*)    All other components within normal limits   I-STAT TROPOININ, ED    Imaging Review No results found.   EKG Interpretation   Date/Time:  Monday December 10 2014 12:25:21 EDT Ventricular Rate:  86 PR Interval:  152 QRS Duration: 84 QT Interval:  342 QTC Calculation: 409 R Axis:   62 Text Interpretation:  Sinus rhythm Probable left atrial enlargement No old  tracing to compare Confirmed by Ethelda ChickJACUBOWITZ  MD, SAM [phone]([id number]) on 12/10/2014  3:38:43 PM      MDM   Final diagnoses:  Chest pain, unspecified chest pain type  Anemia, unspecified anemia type   Patient presents for chest pan that began yesterday at rest.  She states it is worse with movement of her right arm and pressing on the right side of her chest. On exam she is tender to palpation of the ride side of her chest. Her troponin is negative.  Her hgb is 11.0 and she has a history of anemia and is aware of this.  She has heavy menstrual cycles which have required transfusion in the past. Her vitals are normal.  She has no significant cardiac risk factors. She does not take medication for anxiety.  She is PERC negative.  I doubt this is cardiac or pulmonary in nature.  This sounds musculoskeletal in nature.  [facility] and anemia.  She agrees with the plan.    Catha GosselinHanna Patel-Mills, PA-C 12/11/14 0009  Doug SouSam Jacubowitz, MD 12/11/14 [id number]

## 2015-01-16 IMAGING — US US PELVIS COMPLETE
1 series · 13 of 25 positions shown · non-contrast
Comparison: No priors.

CLINICAL DATA: Initial evaluation of 18-year-old female patient
with history of dysfunctional uterine bleeding, with heavy vaginal
bleeding over the prior 4 years.

EXAM:
TRANSABDOMINAL AND TRANSVAGINAL ULTRASOUND OF PELVIS
TECHNIQUE: Both transabdominal and transvaginal ultrasound examinations of the
pelvis were performed. Transabdominal technique was performed for
global imaging of the pelvis including uterus, ovaries, adnexal
regions, and pelvic cul-de-sac. It was necessary to proceed with
endovaginal exam following the transabdominal exam to visualize the
ovaries and endometrium.

[Series 1: us pelvis complete · 0.20mm/px · 13 of 80 slices shown]
[im 1/80]
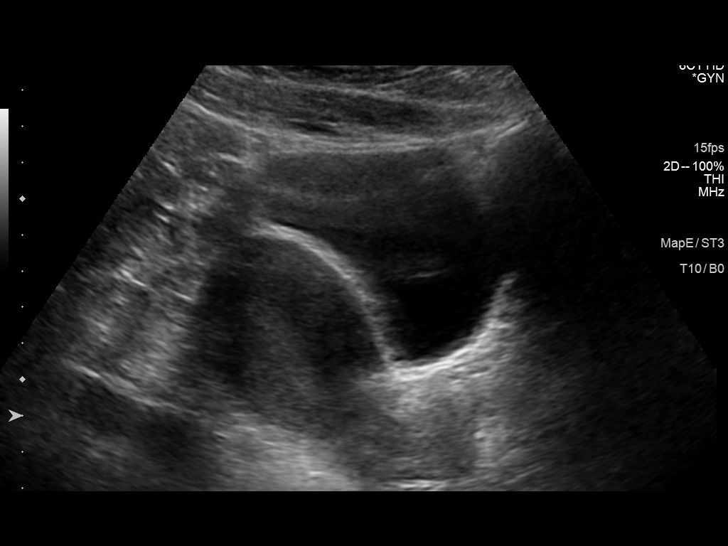
[im 7/80]
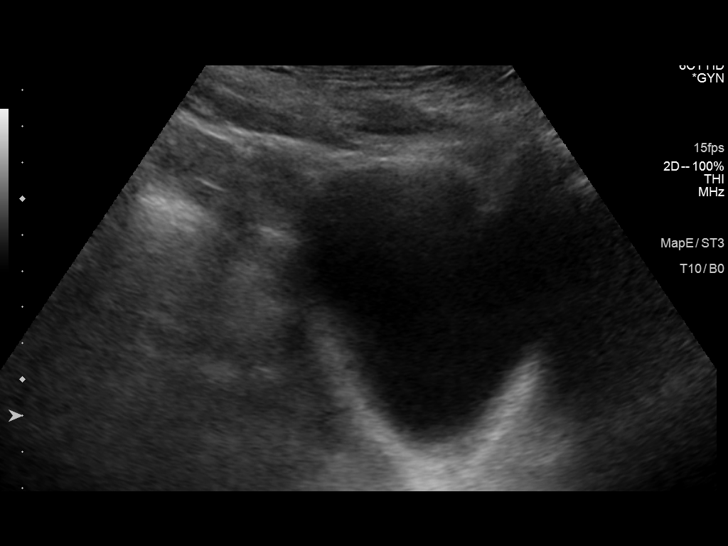
[im 14/80]
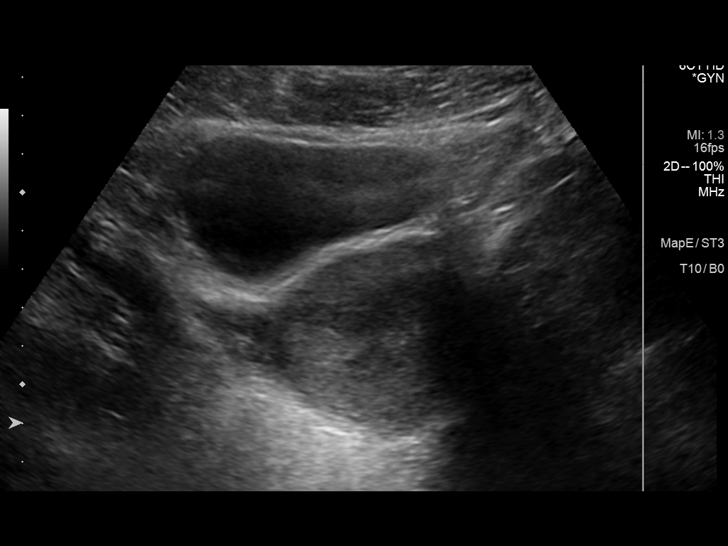
[im 20/80]
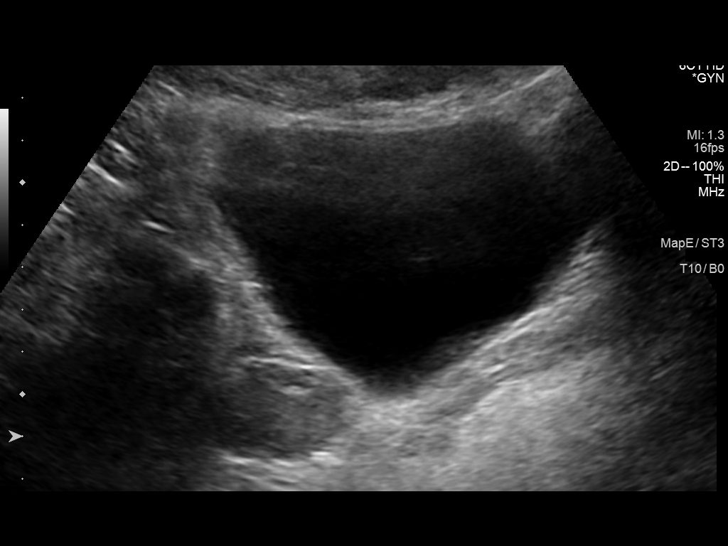
[im 27/80]
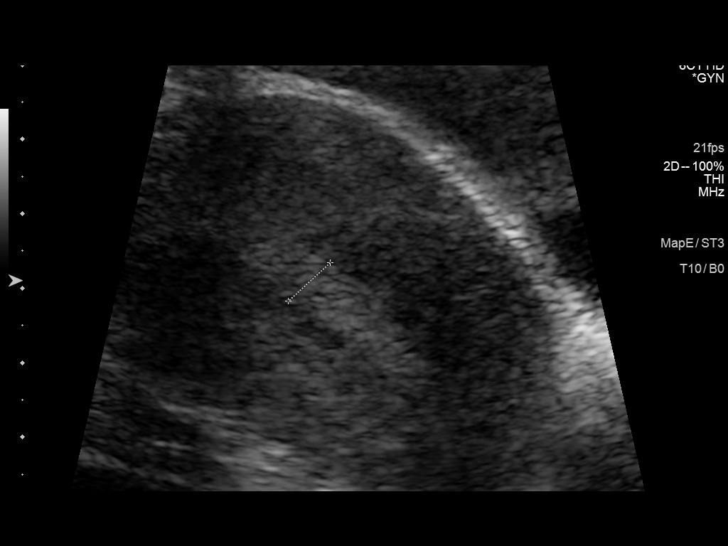
[im 33/80]
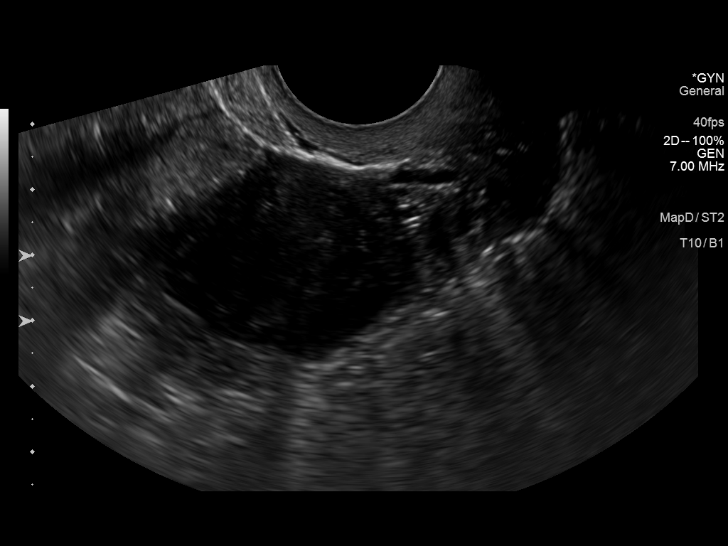
[im 40/80]
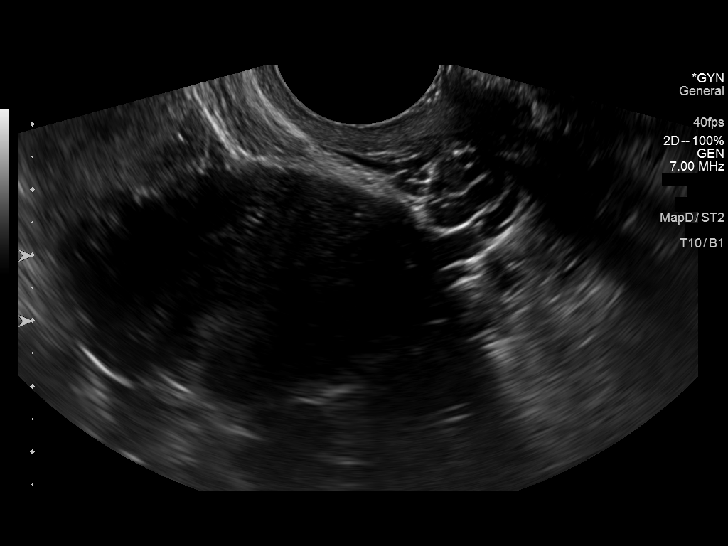
[im 47/80]
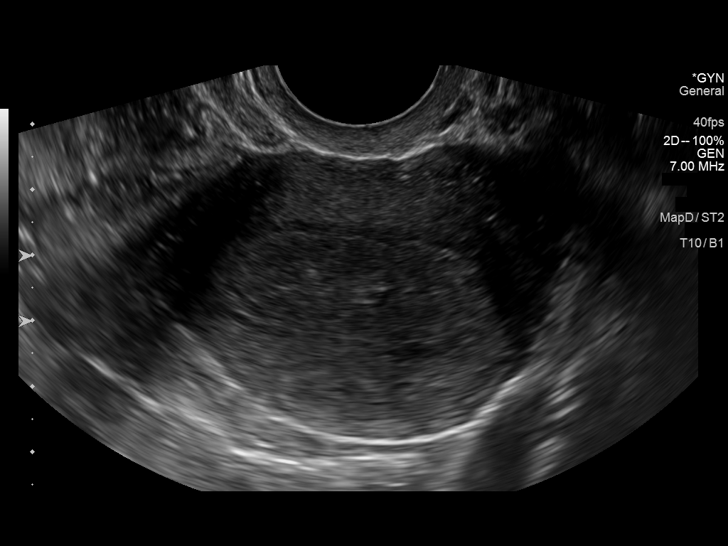
[im 53/80]
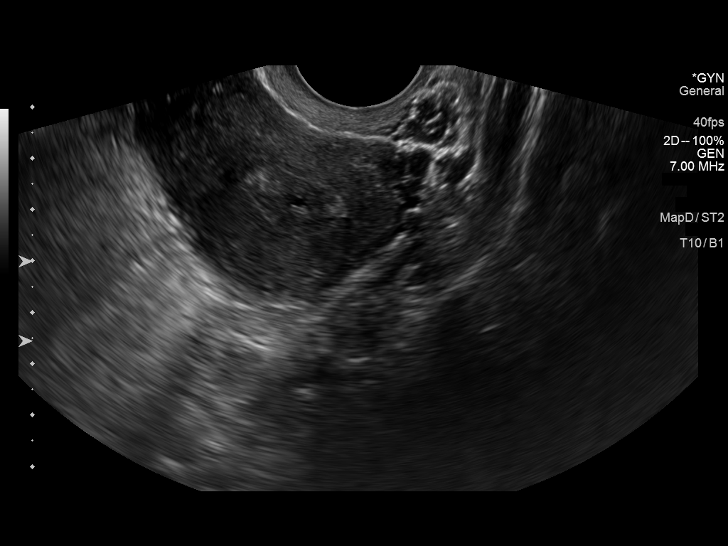
[im 60/80]
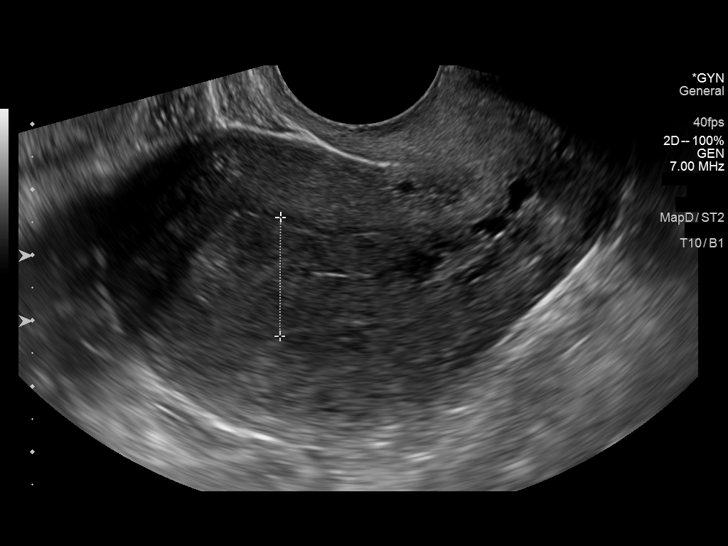
[im 66/80]
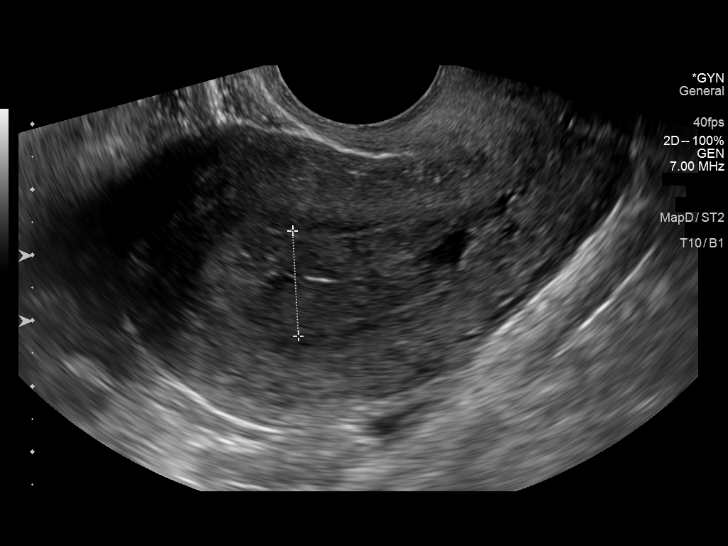
[im 73/80]
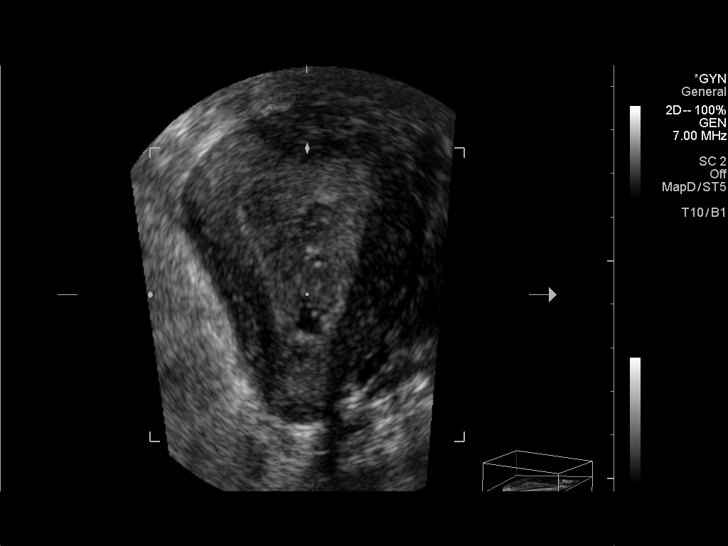
[im 80/80]
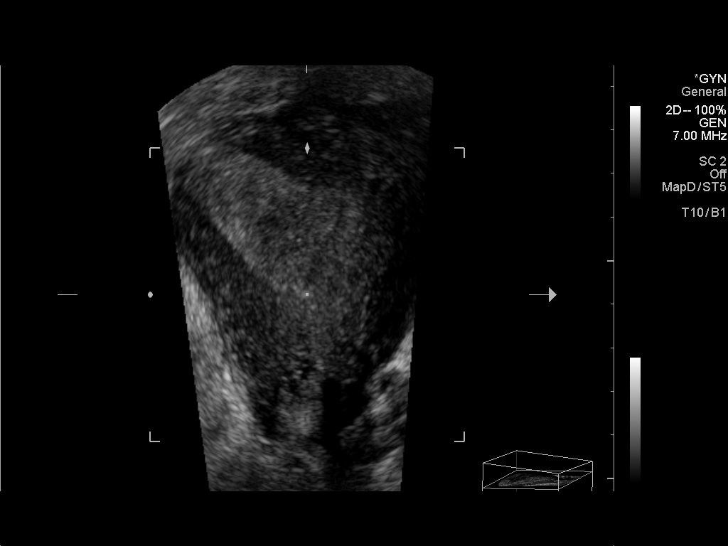

[13 of 25 positions shown; findings below may reference images not displayed]

FINDINGS: Uterus

Measurements: 9.0 x 4.8 x 6.0 cm. No fibroids or other mass
visualized.

Endometrium

Thickness: 18 mm. Slightly heterogeneous in echotexture with
multiple tiny cystic appearing regions.

Right ovary

Measurements: 3.8 x 3.0 x 3.5 cm. Normal appearance/no adnexal mass.

Left ovary

Could not be visualized.

Other findings

No free fluid.
IMPRESSION: 1. Endometrium appears slightly thickened and heterogeneous in
echotexture. Is nonspecific, but could conceivably represent
adenomyosis. If bleeding remains unresponsive to hormonal or medical
therapy, focal lesion work-up with sonohysterogram should be
considered. Endometrial biopsy should also be considered in
pre-menopausal patients at high risk for endometrial carcinoma.
(Ref: Radiological Reasoning: Algorithmic Workup of Abnormal Vaginal
Bleeding with Endovaginal Sonography and Sonohysterography. AJR
9222; 191:S68-73).
2. Left ovary cannot be visualized.

## 2015-05-28 ENCOUNTER — Encounter (HOSPITAL_COMMUNITY): Payer: Self-pay | Admitting: Emergency Medicine

## 2015-05-28 ENCOUNTER — Emergency Department (HOSPITAL_COMMUNITY)
Admission: EM | Admit: 2015-05-28 | Discharge: 2015-05-28 | Disposition: A | Discharge status: Home / Self Care | Payer: PRIVATE HEALTH INSURANCE | Attending: Emergency Medicine | Admitting: Emergency Medicine

## 2015-05-28 DIAGNOSIS — Z79899 Other long term (current) drug therapy: Secondary | ICD-10-CM | POA: Diagnosis not present

## 2015-05-28 DIAGNOSIS — Z792 Long term (current) use of antibiotics: Secondary | ICD-10-CM | POA: Diagnosis not present

## 2015-05-28 DIAGNOSIS — B9789 Other viral agents as the cause of diseases classified elsewhere: Secondary | ICD-10-CM

## 2015-05-28 DIAGNOSIS — J069 Acute upper respiratory infection, unspecified: Secondary | ICD-10-CM | POA: Diagnosis not present

## 2015-05-28 DIAGNOSIS — Z79818 Long term (current) use of other agents affecting estrogen receptors and estrogen levels: Secondary | ICD-10-CM | POA: Insufficient documentation

## 2015-05-28 DIAGNOSIS — D649 Anemia, unspecified: Secondary | ICD-10-CM | POA: Diagnosis not present

## 2015-05-28 DIAGNOSIS — R0981 Nasal congestion: Secondary | ICD-10-CM | POA: Diagnosis present

## 2015-05-28 LAB — CBC WITH DIFFERENTIAL/PLATELET
Basophils Absolute: 0 10*3/uL (ref 0.0–0.1)
Basophils Relative: 0 %
EOS ABS: 0.3 10*3/uL (ref 0.0–0.7)
EOS PCT: 4 %
HCT: 38.9 % (ref 36.0–46.0)
Hemoglobin: 11.7 g/dL — ABNORMAL LOW (ref 12.0–15.0)
Lymphocytes Relative: 27 %
Lymphs Abs: 1.9 10*3/uL (ref 0.7–4.0)
MCH: 23.4 pg — AB (ref 26.0–34.0)
MCHC: 30.1 g/dL (ref 30.0–36.0)
MCV: 77.8 fL — AB (ref 78.0–100.0)
Monocytes Absolute: 0.7 10*3/uL (ref 0.1–1.0)
Monocytes Relative: 10 %
Neutro Abs: 4.2 10*3/uL (ref 1.7–7.7)
Neutrophils Relative %: 59 %
Platelets: 430 10*3/uL — ABNORMAL HIGH (ref 150–400)
RBC: 5 MIL/uL (ref 3.87–5.11)
RDW: 15.9 % — ABNORMAL HIGH (ref 11.5–15.5)
WBC: 7 10*3/uL (ref 4.0–10.5)

## 2015-05-28 MED ORDER — HYDROCODONE-HOMATROPINE 5-1.5 MG/5ML PO SYRP
5.0000 mL | ORAL_SOLUTION | Freq: Once | ORAL | Status: AC
  Administered 2015-05-28: 5 mL via ORAL
  Filled 2015-05-28: qty 5

## 2015-05-28 MED ORDER — HYDROCODONE-HOMATROPINE 5-1.5 MG/5ML PO SYRP: 5.0000 mL | ORAL_SOLUTION | Freq: Four times a day (QID) | ORAL | Status: DC | PRN

## 2015-05-28 NOTE — ED Provider Notes (Signed)
CSN: [id number]     Arrival date & time 05/28/15  1840 History  By signing my name below, I, Emmanuella Mensah, attest that this documentation has been prepared under the direction and in the presence of [employer], PA-C. Electronically Signed: Angelene Giovanni, ED Scribe. 05/29/2015. 7:31 PM.    Chief Complaint  Patient presents with  . Nasal Congestion   The history is provided by the patient. No language interpreter was used.   HPI Comments: Rebecca Coleman is a 19 y.o. female with a hx of anemia who presents to the Emergency Department complaining of gradually worsening moderate nasal congestion onset yesterday. She also reports moderate gradually worsening productive cough with clear sputum onset today. She adds that she has had decreased appetite today. She denies that she is a current smoker. She denies a hx of asthma but adds that she was received an inhaler for a wheezing cough in the past. She reports that she took DayQuil PTA. Her LNMP was 3 weeks ago. She denies any chance that she could be pregnant since she is currently on birth control. She states that she would like to get her Hb levels checked because she has a history of anemia and has had heavy menstrual cycles lately..   Past Medical History  Diagnosis Date  . Anemia   . History of blood transfusion   . Medical history non-contributory    Past Surgical History  Procedure Laterality Date  . No past surgeries     Family History  Problem Relation Age of Onset  . Hypertension Sister   . Diabetes Maternal Aunt   . Cancer Mother   . Mental retardation Cousin    Social History  Substance Use Topics  . Smoking status: Never Smoker   . Smokeless tobacco: Never Used  . Alcohol Use: Yes   OB History    Gravida Para Term Preterm AB TAB SAB Ectopic Multiple Living   0              Review of Systems  HENT: Positive for congestion and sore throat. Negative for voice change.   Respiratory: Positive for cough.        Allergies  Shrimp  Home Medications   Prior to Admission medications   Medication Sig Start Date End Date Taking? Authorizing Hollye Pritt  albuterol (PROVENTIL HFA;VENTOLIN HFA) 108 (90 BASE) MCG/ACT inhaler Inhale 2 puffs into the lungs every 6 (six) hours as needed for wheezing or shortness of breath.    Historical Jenafer Winterton, MD  amoxicillin (AMOXIL) 500 MG capsule Take 1 capsule (500 mg total) by mouth 3 (three) times daily. 06/07/14   Lesly Dukes, MD  ferrous sulfate 325 (65 FE) MG tablet Take 1 tablet (325 mg total) by mouth 3 (three) times daily with meals. 06/05/14   Adam Phenix, MD  HYDROcodone-homatropine [facility]) 5-1.5 MG/5ML syrup Take 5 mLs by mouth every 6 (six) hours as needed for cough. 05/28/15   Catha Gosselin, PA-C  Norethin-Eth Estradiol-Fe [facility] FE,WYMZYA Cecille [place]) 0.4-35 MG-MCG tablet Chew 1 tablet by mouth daily. 06/05/14   Adam Phenix, MD   BP 158/91 mmHg  Pulse 111  Temp(Src) 97.4 F (36.3 C) (Oral)  Resp 18  SpO2 99%  LMP 05/14/2015 Physical Exam  Constitutional: She is oriented to person, place, and time. She appears well-developed and well-nourished.  HENT:  Head: Normocephalic and atraumatic.  Mouth/Throat: No oropharyngeal exudate.  No kissing tonsils No hot potato voice No drooling or trismus  No  tonsillar exudates or erythema  Cardiovascular: Normal rate.   Pulmonary/Chest: Effort normal.  Lungs clear to auscultation bilaterally    Abdominal: She exhibits no distension.  Neurological: She is alert and oriented to person, place, and time.  Skin: Skin is warm and dry.  Psychiatric: She has a normal mood and affect.  Nursing note and vitals reviewed.   ED Course  Procedures (including critical care time) DIAGNOSTIC STUDIES: Oxygen Saturation is 99 in RA normal by my interpretation.    COORDINATION OF CARE: 7:27 PM- Pt advised of plan for treatment and pt agrees.   Labs Review Labs Reviewed  CBC WITH  DIFFERENTIAL/PLATELET - Abnormal; Notable for the following:    Hemoglobin 11.7 (*)    MCV 77.8 (*)    MCH 23.4 (*)    RDW 15.9 (*)    Platelets 430 (*)    All other components within normal limits    Imaging Review No results found. Catha Gosselin, PA-C personally reviewed and evaluated these lab results as part of her medical decision-making.   EKG Interpretation None      MDM   Final diagnoses:  Viral URI with cough  Patient presents for nasal congestion, cough, and requesting to have her hemoglobin level checked. She is well-appearing and in no acute distress. Her vital signs are stable. Her hemoglobin is 11.7. I suspect that this is most likely a viral upper respiratory infection. Her throat and lung exam is normal. I discussed return precautions with the patient as well as follow-up. She was given Hycodan for cough. She verbally agrees with the plan. I personally performed the services described in this documentation, which was scribed in my presence. The recorded information has been reviewed and is accurate.   Catha Gosselin, PA-C 05/29/15 1811  Lyndal Pulley, MD 06/03/15 [phone]

## 2015-05-28 NOTE — ED Notes (Signed)
Pt c/o nasal congestion x2 days. Reported symptoms started after sleeping under a air conditioner 2days and after running barefoot in the rain yesterday, symptoms worsened.  Reports taking dayquil.  Denies body aches or chills.

## 2015-05-28 NOTE — Discharge Instructions (Signed)
Viral Infections Use the resource guide below to find a primary care physician. Dizziness, weakness, lightheadedness or shortness of breath.  A virus is a type of germ. Viruses can cause:  Minor sore throats.  Aches and pains.  Headaches.  Runny nose.  Rashes.  Watery eyes.  Tiredness.  Coughs.  Loss of appetite.  Feeling sick to your stomach (nausea).  Throwing up (vomiting).  Watery poop (diarrhea). HOME CARE   Only take medicines as told by your doctor.  Drink enough water and fluids to keep your pee (urine) clear or pale yellow. Sports drinks are a good choice.  Get plenty of rest and eat healthy. Soups and broths with crackers or rice are fine. GET HELP RIGHT AWAY IF:   You have a very bad headache.  You have shortness of breath.  You have chest pain or neck pain.  You have an unusual rash.  You cannot stop throwing up.  You have watery poop that does not stop.  You cannot keep fluids down.  You or your child has a temperature by mouth above 102 F (38.9 C), not controlled by medicine.  Your baby is older than 3 months with a rectal temperature of 102 F (38.9 C) or higher.  Your baby is 97 months old or younger with a rectal temperature of 100.4 F (38 C) or higher. MAKE SURE YOU:   Understand these instructions.  Will watch this condition.  Will get help right away if you are not doing well or get worse. Document Released: 07/30/2008 Document Revised: 11/09/2011 Document Reviewed: 12/23/2010 [facility] Patient Information 2015 [place], [place]. This information is not intended to replace advice given to you by your health care provider. Make sure you discuss any questions you have with your health care provider.  Emergency Department Resource Guide 1) Find a Doctor and Pay Out of Pocket Although you won't have to find out who is covered by your insurance plan, it is a good idea to ask around and get recommendations. You will then need to call  the office and see if the doctor you have chosen will accept you as a new patient and what types of options they offer for patients who are self-pay. Some doctors offer discounts or will set up payment plans for their patients who do not have insurance, but you will need to ask so you aren't surprised when you get to your appointment.  2) Contact Your Local Health Department Not all health departments have doctors that can see patients for sick visits, but many do, so it is worth a call to see if yours does. If you don't know where your local health department is, you can check in your phone book. The CDC also has a tool to help you locate your state's health department, and many state websites also have listings of all of their local health departments.  3) Find a Walk-in Clinic If your illness is not likely to be very severe or complicated, you may want to try a walk in clinic. These are popping up all over the country in pharmacies, drugstores, and shopping centers. They're usually staffed by nurse practitioners or physician assistants that have been trained to treat common illnesses and complaints. They're usually fairly quick and inexpensive. However, if you have serious medical issues or chronic medical problems, these are probably not your best option.  No Primary Care Doctor: - Call Health Connect at  [phone] - they can help you locate a primary care doctor  that  accepts your insurance, provides certain services, etc. - Physician Referral Service- [phone]  Chronic Pain Problems: Organization         Address  Phone   Notes  [facility]  [phone] Patients need to be referred by their primary care doctor.   Medication Assistance: Organization         Address  Phone   Notes  [facility] Medication [facility] [address] [place], [place] [address] [phone] --Must be a resident of [facility] -- Must have NO insurance  coverage whatsoever (no Medicaid/ Medicare, etc.) -- The pt. MUST have a primary care doctor that directs their care regularly and follows them in the community   MedAssist  [phone]   [employer]  [phone]    Agencies that provide inexpensive medical care: Organization         Address  Phone   Notes  [place]  [phone]   [facility]    [phone]   [facility] [place], [place] [address] [phone]   [place] [address] [place] [phone]   Planned Parenthood    [phone]   [facility]    [phone]   [place] [address], [place] Phone:  [phone], Fax:  [phone] Hours of Operation:  9 am - 6 pm, M-F.  Also accepts Medicaid/Medicare and self-pay.  [facility] for [place] [address], [address], [place] Phone: [phone], Fax: [phone]. Hours of Operation:  8:30 am - 5:30 pm, M-F.  Also accepts Medicaid and self-pay.  [facility] [place] [address], [place] Phone: [phone]   [place], [place], [place] [phone], Ext. [phone] Mondays & Thursdays: 7-9 AM.  First 15 patients are seen on a first come, first serve basis.    [place] Providers:  Organization         Address  Phone   Notes  [facility] [address], [place] [phone] Also accepts self-pay patients.  [facility] [address], [place]  [phone]   [place], [address], [place] [phone]   [facility] [address], [place] [phone]   Lucianne Lei [address], [place]   [phone] Only accepts [place] patients after they have their  name applied to their card.   Self-Pay (no insurance) in [facility]:  Organization         Address  Phone   Notes  Sickle Cell Patients, [facility] [place] [phone]   [facility] [place] [phone]   [facility] [place]  [place], [place], [place] [phone]   [facility]/Dr. Osei-Bonsu  [address] [place] or [address], [place] [phone] Phone number for both [place] and [place] locations is the same.  [facility] [address] [place] [phone]   [facility] [address] [place] or [address] [phone]  [facility] [address], [place] [phone], phone; [phone], fax Sees patients 1st and 3rd Saturday of every month.  Must not qualify for public or private insurance (i.e. Medicaid, Medicare, [place] Health Choice, Veterans' Benefits) • Household income should be no more than 200% of the poverty level •The clinic cannot treat you if you are pregnant or think you are pregnant • Sexually transmitted diseases are not treated at the clinic.  ° ° °Dental Care: °Organization         Address  Phone  Notes  °[facility] [address], [place] [phone] Accepts children up to age 21 who are enrolled in Medicaid or [place] Health Choice; pregnant women with a Medicaid card; and children who have applied for Medicaid or [place] Health Choice, but were declined, whose parents can pay a reduced fee at time of service.  °[facility] [place]  [address], [place] [phone] Accepts children up to age 21 who are enrolled in Medicaid or [place] Health Choice; pregnant women with a Medicaid card; and children who have applied for  Medicaid or [place] Health Choice, but were declined, whose parents can pay a reduced fee at time of service.  °[facility] ° [address], [place] [phone] Patients are seen by appointment only. Walk-ins are not accepted. [facility] will see patients 18 years of age and older. °Monday - Tuesday (8am-5pm) °Most Wednesdays (8:30-5pm) °$30 per visit, cash only  °[facility] ° [address], [place] [phone] Patients are seen by appointment only. Walk-ins are not accepted. [facility] will see patients 18 years of age and older. °One Wednesday Evening (Monthly: Volunteer Based).  $30 per visit, cash only  °[facility]  [phone] for adults; Children under age 4, call Graduate Pediatric Dentistry at [phone]. Children aged 4-14, please call [phone] to request a pediatric application. ° Dental services are provided in all areas of dental care including fillings, crowns and bridges, complete and partial dentures, implants, gum treatment, root canals, and extractions. Preventive care is also provided. Treatment is provided to both adults and children. °Patients are selected via a lottery and there is often a waiting list. °  °[facility] [address], °[place] ° ([phone] [email or web address] °  °[facility] [address], [place], [place] ([phone], Ext. [phone] Second and Fourth Thursday of each month, opens at 6:30 AM; Clinic ends at 9 AM.  Patients are seen on a first-come first-served basis, and a limited number are seen during each clinic.  ° °[facility] ° [address], [place], [place] [phone]   Eligibility Requirements °You must have lived in [place], [place], or [place] counties for at least the last three months. °  You cannot be eligible for state or federal sponsored healthcare insurance, including Veterans Administration, Medicaid,  or Medicare. °  You generally cannot be eligible for healthcare insurance through your employer.  °  How to apply: °Eligibility screenings are held every Tuesday and Wednesday afternoon from 1:00 pm until 4:00 pm. You do not need an appointment for the interview!  °[facility] [address], [place], [place] [phone]   °[place] [facility]  [phone]   °[place] [facility]  [phone]   °[place] [facility]  [phone]    Behavioral Health Resources in the Community: Intensive Outpatient Programs Organization         Address  Phone  Notes  [place]. [address] [place], [place] [phone]   [facility] Outpatient [address], [place], [place]   [facility]: [facility] [address], [place], [place]   [place] [address]  [place], [place] or [phone]   Substance Abuse Resources Organization         Address  Phone  Notes  [facility]  [phone])[phone]   [place]  [phone]   [facility] [place]   Chinita Pester  5852095131   Residential & Outpatient Substance Abuse Program  [phone]) [phone]   Psychological Services Organization         Address  Phone  Notes  [facility] [place]  [place]  [phone]   [place] [address] [place] or [phone]    Mobile Crisis Teams Organization         Address  Phone  Notes  [facility], Mobile Crisis Care Unit  2010261037   [facility]  [address] [place], [place]   [place] [address], [place] [phone]    Self-Help/Support Groups Organization         Address  Phone             Notes  [place] of [place] - variety of support groups   [place] Call for more information  Narcotics Anonymous (NA), [facility] [address], [employer] [place]  2 meetings at this location   Special educational needs teacher         Address  Phone  Notes  [facility] [place],    [place]  [phone]-([phone]) [phone]   [facility]  [address] [place] [id number], [place], [place]   [place], [place] [phone] Admissions: 8am-3pm M-F  Incentives Substance [place] [address]    [place], [place] [phone]   [facility] [address] [place], [place], [place]   [facility] [place] [facility] [address]  [place], [place]   [facility] - Intensive Outpatient [address] [place] [address], [place], [place]   [facility] ([place].,  [place], [place] [phone] or [phone]   [facility] ([facility]) [address] [place], [place] Accepts Medicaid  [facility] [place] [address],  [place] [phone] Substance Abuse/Addiction Treatment   [facility] Organization         Address  Phone  Notes  [facility]  [phone]   Domenic Schwab, PhD [address] [place], [place]   [phone] or [phone]   [place], [place] [phone]   [place] [address], [place], [place] [phone] Insurance/Medicaid/sponsorship through [employer] and Families [address], [place]                                    [place], [place] [phone] [place]  [address]  ° [place], [place] [phone]) [phone]    °Dr. Arfeen  ([phone]) [phone]   °[facility] [place]  [employer] [place] [facility] 1) [address], [place] °2) [address], [place] °3)  [address], [place] ([phone]) [phone] °([phone]) [phone] ° °(336) [phone]   °[place] Child Abuse Hotline (336) [phone] or ([phone]) [phone] (After Hours)    ° ° ° °

## 2015-06-23 ENCOUNTER — Emergency Department (HOSPITAL_COMMUNITY): Payer: PRIVATE HEALTH INSURANCE

## 2015-06-23 ENCOUNTER — Emergency Department (HOSPITAL_COMMUNITY)
Admission: EM | Admit: 2015-06-23 | Discharge: 2015-06-23 | Disposition: A | Discharge status: Home / Self Care | Payer: PRIVATE HEALTH INSURANCE | Attending: Emergency Medicine | Admitting: Emergency Medicine

## 2015-06-23 ENCOUNTER — Encounter (HOSPITAL_COMMUNITY): Payer: Self-pay | Admitting: Emergency Medicine

## 2015-06-23 DIAGNOSIS — Z79899 Other long term (current) drug therapy: Secondary | ICD-10-CM | POA: Insufficient documentation

## 2015-06-23 DIAGNOSIS — R51 Headache: Secondary | ICD-10-CM | POA: Diagnosis not present

## 2015-06-23 DIAGNOSIS — Z3202 Encounter for pregnancy test, result negative: Secondary | ICD-10-CM | POA: Diagnosis not present

## 2015-06-23 DIAGNOSIS — R079 Chest pain, unspecified: Secondary | ICD-10-CM | POA: Diagnosis present

## 2015-06-23 DIAGNOSIS — R519 Headache, unspecified: Secondary | ICD-10-CM

## 2015-06-23 DIAGNOSIS — R42 Dizziness and giddiness: Secondary | ICD-10-CM | POA: Insufficient documentation

## 2015-06-23 DIAGNOSIS — R0789 Other chest pain: Secondary | ICD-10-CM | POA: Diagnosis not present

## 2015-06-23 DIAGNOSIS — Z792 Long term (current) use of antibiotics: Secondary | ICD-10-CM | POA: Diagnosis not present

## 2015-06-23 DIAGNOSIS — R110 Nausea: Secondary | ICD-10-CM | POA: Diagnosis not present

## 2015-06-23 DIAGNOSIS — D649 Anemia, unspecified: Secondary | ICD-10-CM | POA: Insufficient documentation

## 2015-06-23 LAB — I-STAT TROPONIN, ED: TROPONIN I, POC: 0 ng/mL (ref 0.00–0.08)

## 2015-06-23 LAB — BASIC METABOLIC PANEL
ANION GAP: 7 (ref 5–15)
BUN: 9 mg/dL (ref 6–20)
CO2: 28 mmol/L (ref 22–32)
Calcium: 9.5 mg/dL (ref 8.9–10.3)
Chloride: 103 mmol/L (ref 101–111)
Creatinine, Ser: 0.74 mg/dL (ref 0.44–1.00)
GFR calc Af Amer: >60 mL/min (ref >60)
Glucose, Bld: 100 mg/dL — ABNORMAL HIGH (ref 65–99)
POTASSIUM: 3.8 mmol/L (ref 3.5–5.1)
Sodium: 138 mmol/L (ref 135–145)

## 2015-06-23 LAB — CBC
HEMATOCRIT: 37 % (ref 36.0–46.0)
Hemoglobin: 11.1 g/dL — ABNORMAL LOW (ref 12.0–15.0)
MCH: 23.1 pg — ABNORMAL LOW (ref 26.0–34.0)
MCHC: 30 g/dL (ref 30.0–36.0)
MCV: 76.9 fL — ABNORMAL LOW (ref 78.0–100.0)
Platelets: 473 10*3/uL — ABNORMAL HIGH (ref 150–400)
RBC: 4.81 MIL/uL (ref 3.87–5.11)
RDW: 16.5 % — AB (ref 11.5–15.5)
WBC: 7.8 10*3/uL (ref 4.0–10.5)

## 2015-06-23 LAB — I-STAT BETA HCG BLOOD, ED (MC, WL, AP ONLY)

## 2015-06-23 MED ORDER — DIPHENHYDRAMINE HCL 50 MG/ML IJ SOLN
25.0000 mg | Freq: Once | INTRAMUSCULAR | Status: AC
  Administered 2015-06-23: 25 mg via INTRAVENOUS
  Filled 2015-06-23: qty 1

## 2015-06-23 MED ORDER — METOCLOPRAMIDE HCL 10 MG PO TABS: 10.0000 mg | ORAL_TABLET | Freq: Four times a day (QID) | ORAL | Status: DC | PRN

## 2015-06-23 MED ORDER — NAPROXEN 500 MG PO TABS: 500.0000 mg | ORAL_TABLET | Freq: Two times a day (BID) | ORAL | Status: DC | PRN

## 2015-06-23 MED ORDER — METOCLOPRAMIDE HCL 5 MG/ML IJ SOLN
10.0000 mg | Freq: Once | INTRAMUSCULAR | Status: AC
  Administered 2015-06-23: 10 mg via INTRAVENOUS
  Filled 2015-06-23: qty 2

## 2015-06-23 MED ORDER — SODIUM CHLORIDE 0.9 % IV BOLUS (SEPSIS)
1000.0000 mL | Freq: Once | INTRAVENOUS | Status: AC
  Administered 2015-06-23: 1000 mL via INTRAVENOUS

## 2015-06-23 MED ORDER — KETOROLAC TROMETHAMINE 30 MG/ML IJ SOLN
30.0000 mg | Freq: Once | INTRAMUSCULAR | Status: AC
  Administered 2015-06-23: 30 mg via INTRAVENOUS
  Filled 2015-06-23: qty 1

## 2015-06-23 NOTE — Discharge Instructions (Signed)
Use tylenol or naprosyn as directed as needed for headaches, and use reglan as directed as needed for nausea or headaches. Stay well hydrated with plenty of water. Follow up with your regular doctor in 1 week. Return to the ER for changes or worsening symptoms.   Nausea, Adult Nausea means you feel sick to your stomach or need to throw up (vomit). It may be a sign of a more serious problem. If nausea gets worse, you may throw up. If you throw up a lot, you may lose too much body fluid (dehydration). HOME CARE   Get plenty of rest.  Ask your doctor how to replace body fluid losses (rehydrate).  Eat small amounts of food. Sip liquids more often.  Take all medicines as told by your doctor. GET HELP RIGHT AWAY IF:  You have a fever.  You pass out (faint).  You keep throwing up or have blood in your throw up.  You are very weak, have dry lips or a dry mouth, or you are very thirsty (dehydrated).  You have dark or bloody poop (stool).  You have very bad chest or belly (abdominal) pain.  You do not get better after 2 days, or you get worse.  You have a headache. MAKE SURE YOU:  Understand these instructions.  Will watch your condition.  Will get help right away if you are not doing well or get worse.   This information is not intended to replace advice given to you by your health care provider. Make sure you discuss any questions you have with your health care provider.   Document Released: 08/06/2011 Document Revised: 11/09/2011 Document Reviewed: 08/06/2011 Elsevier Interactive Patient Education 2016 [employer]  General Headache Without Cause A headache is pain or discomfort felt around the head or neck area. The specific cause of a headache may not be found. There are many causes and types of headaches. A few common ones are:  Tension headaches.  Migraine headaches.  Cluster headaches.  Chronic daily headaches. HOME CARE INSTRUCTIONS  Watch your condition for  any changes. Take these steps to help with your condition: Managing Pain  Take over-the-counter and prescription medicines only as told by your health care provider.  Lie down in a dark, quiet room when you have a headache.  If directed, apply ice to the head and neck area:  Put ice in a plastic bag.  Place a towel between your skin and the bag.  Leave the ice on for 20 minutes, 2-3 times per day.  Use a heating pad or hot shower to apply heat to the head and neck area as told by your health care provider.  Keep lights dim if bright lights bother you or make your headaches worse. Eating and Drinking  Eat meals on a regular schedule.  Limit alcohol use.  Decrease the amount of caffeine you drink, or stop drinking caffeine. General Instructions  Keep all follow-up visits as told by your health care provider. This is important.  Keep a headache journal to help find out what may trigger your headaches. For example, write down:  What you eat and drink.  How much sleep you get.  Any change to your diet or medicines.  Try massage or other relaxation techniques.  Limit stress.  Sit up straight, and do not tense your muscles.  Do not use tobacco products, including cigarettes, chewing tobacco, or e-cigarettes. If you need help quitting, ask your health care provider.  Exercise regularly as told by  your health care provider.  Sleep on a regular schedule. Get 7-9 hours of sleep, or the amount recommended by your health care provider. SEEK MEDICAL CARE IF:   Your symptoms are not helped by medicine.  You have a headache that is different from the usual headache.  You have nausea or you vomit.  You have a fever. SEEK IMMEDIATE MEDICAL CARE IF:   Your headache becomes severe.  You have repeated vomiting.  You have a stiff neck.  You have a loss of vision.  You have problems with speech.  You have pain in the eye or ear.  You have muscular weakness or loss of  muscle control.  You lose your balance or have trouble walking.  You feel faint or pass out.  You have confusion.   This information is not intended to replace advice given to you by your health care provider. Make sure you discuss any questions you have with your health care provider.   Document Released: 08/17/2005 Document Revised: 05/08/2015 Document Reviewed: 12/10/2014 Elsevier Interactive Patient Education 2016 [employer]  Nonspecific Chest Pain  Chest pain can be caused by many different conditions. There is always a chance that your pain could be related to something serious, such as a heart attack or a blood clot in your lungs. Chest pain can also be caused by conditions that are not life-threatening. If you have chest pain, it is very important to follow up with your health care provider. CAUSES  Chest pain can be caused by:  Heartburn.  Pneumonia or bronchitis.  Anxiety or stress.  Inflammation around your heart (pericarditis) or lung (pleuritis or pleurisy).  A blood clot in your lung.  A collapsed lung (pneumothorax). It can develop suddenly on its own (spontaneous pneumothorax) or from trauma to the chest.  Shingles infection (varicella-zoster virus).  Heart attack.  Damage to the bones, muscles, and cartilage that make up your chest wall. This can include:  Bruised bones due to injury.  Strained muscles or cartilage due to frequent or repeated coughing or overwork.  Fracture to one or more ribs.  Sore cartilage due to inflammation (costochondritis). RISK FACTORS  Risk factors for chest pain may include:  Activities that increase your risk for trauma or injury to your chest.  Respiratory infections or conditions that cause frequent coughing.  Medical conditions or overeating that can cause heartburn.  Heart disease or family history of heart disease.  Conditions or health behaviors that increase your risk of developing a blood clot.  Having  had chicken pox (varicella zoster). SIGNS AND SYMPTOMS Chest pain can feel like:  Burning or tingling on the surface of your chest or deep in your chest.  Crushing, pressure, aching, or squeezing pain.  Dull or sharp pain that is worse when you move, cough, or take a deep breath.  Pain that is also felt in your back, neck, shoulder, or arm, or pain that spreads to any of these areas. Your chest pain may come and go, or it may stay constant. DIAGNOSIS Lab tests or other studies may be needed to find the cause of your pain. Your health care provider may have you take a test called an ambulatory ECG (electrocardiogram). An ECG records your heartbeat patterns at the time the test is performed. You may also have other tests, such as:  Transthoracic echocardiogram (TTE). During echocardiography, sound waves are used to create a picture of all of the heart structures and to look at how blood flows  through your heart.  Transesophageal echocardiogram (TEE).This is a more advanced imaging test that obtains images from inside your body. It allows your health care provider to see your heart in finer detail.  Cardiac monitoring. This allows your health care provider to monitor your heart rate and rhythm in real time.  Holter monitor. This is a portable device that records your heartbeat and can help to diagnose abnormal heartbeats. It allows your health care provider to track your heart activity for several days, if needed.  Stress tests. These can be done through exercise or by taking medicine that makes your heart beat more quickly.  Blood tests.  Imaging tests. TREATMENT  Your treatment depends on what is causing your chest pain. Treatment may include:  Medicines. These may include:  Acid blockers for heartburn.  Anti-inflammatory medicine.  Pain medicine for inflammatory conditions.  Antibiotic medicine, if an infection is present.  Medicines to dissolve blood clots.  Medicines to  treat coronary artery disease.  Supportive care for conditions that do not require medicines. This may include:  Resting.  Applying heat or cold packs to injured areas.  Limiting activities until pain decreases. HOME CARE INSTRUCTIONS  If you were prescribed an antibiotic medicine, finish it all even if you start to feel better.  Avoid any activities that bring on chest pain.  Do not use any tobacco products, including cigarettes, chewing tobacco, or electronic cigarettes. If you need help quitting, ask your health care provider.  Do not drink alcohol.  Take medicines only as directed by your health care provider.  Keep all follow-up visits as directed by your health care provider. This is important. This includes any further testing if your chest pain does not go away.  If heartburn is the cause for your chest pain, you may be told to keep your head raised (elevated) while sleeping. This reduces the chance that acid will go from your stomach into your esophagus.  Make lifestyle changes as directed by your health care provider. These may include:  Getting regular exercise. Ask your health care provider to suggest some activities that are safe for you.  Eating a heart-healthy diet. A registered dietitian can help you to learn healthy eating options.  Maintaining a healthy weight.  Managing diabetes, if necessary.  Reducing stress. SEEK MEDICAL CARE IF:  Your chest pain does not go away after treatment.  You have a rash with blisters on your chest.  You have a fever. SEEK IMMEDIATE MEDICAL CARE IF:   Your chest pain is worse.  You have an increasing cough, or you cough up blood.  You have severe abdominal pain.  You have severe weakness.  You faint.  You have chills.  You have sudden, unexplained chest discomfort.  You have sudden, unexplained discomfort in your arms, back, neck, or jaw.  You have shortness of breath at any time.  You suddenly start to  sweat, or your skin gets clammy.  You feel nauseous or you vomit.  You suddenly feel light-headed or dizzy.  Your heart begins to beat quickly, or it feels like it is skipping beats. These symptoms may represent a serious problem that is an emergency. Do not wait to see if the symptoms will go away. Get medical help right away. Call your local emergency services (911 in the [place] Do not drive yourself to the hospital.   This information is not intended to replace advice given to you by your health care provider. Make sure you discuss any  questions you have with your health care provider.   Document Released: 05/27/2005 Document Revised: 09/07/2014 Document Reviewed: 03/23/2014 Elsevier Interactive Patient Education 2016 [employer]  Dizziness Dizziness is a common problem. It makes you feel unsteady or lightheaded. You may feel like you are about to pass out (faint). Dizziness can lead to injury if you stumble or fall. Anyone can get dizzy, but dizziness is more common in older adults. This condition can be caused by a number of things, including:  Medicines.  Dehydration.  Illness. HOME CARE Following these instructions may help with your condition: Eating and Drinking  Drink enough fluid to keep your pee (urine) clear or pale yellow. This helps to keep you from getting dehydrated. Try to drink more clear fluids, such as water.  Do not drink alcohol.  Limit how much caffeine you drink or eat if told by your doctor.  Limit how much salt you drink or eat if told by your doctor. Activity  Avoid making quick movements.  When you stand up from sitting in a chair, steady yourself until you feel okay.  In the morning, first sit up on the side of the bed. When you feel okay, stand slowly while you hold onto something. Do this until you know that your balance is fine.  Move your legs often if you need to stand in one place for a long time. Tighten and relax your muscles in your  legs while you are standing.  Do not drive or use heavy machinery if you feel dizzy.  Avoid bending down if you feel dizzy. Place items in your home so that they are easy for you to reach without leaning over. Lifestyle  Do not use any tobacco products, including cigarettes, chewing tobacco, or electronic cigarettes. If you need help quitting, ask your doctor.  Try to lower your stress level, such as with yoga or meditation. Talk with your doctor if you need help. General Instructions  Watch your dizziness for any changes.  Take medicines only as told by your doctor. Talk with your doctor if you think that your dizziness is caused by a medicine that you are taking.  Tell a friend or a family member that you are feeling dizzy. If he or she notices any changes in your behavior, have this person call your doctor.  Keep all follow-up visits as told by your doctor. This is important. GET HELP IF:  Your dizziness does not go away.  Your dizziness or light-headedness gets worse.  You feel sick to your stomach (nauseous).  You have trouble hearing.  You have new symptoms.  You are unsteady on your feet or you feel like the room is spinning. GET HELP RIGHT AWAY IF:  You throw up (vomit) or have diarrhea and are unable to eat or drink anything.  You have trouble:  Talking.  Walking.  Swallowing.  Using your arms, hands, or legs.  You feel generally weak.  You are not thinking clearly or you have trouble forming sentences. It may take a friend or family member to notice this.  You have:  Chest pain.  Pain in your belly (abdomen).  Shortness of breath.  Sweating.  Your vision changes.  You are bleeding.  You have a headache.  You have neck pain or a stiff neck.  You have a fever.   This information is not intended to replace advice given to you by your health care provider. Make sure you discuss any questions you have with  your health care provider.     Document Released: 08/06/2011 Document Revised: 01/01/2015 Document Reviewed: 08/13/2014 [employer] Interactive Patient Education [employer]

## 2015-06-23 NOTE — ED Provider Notes (Signed)
CSN: [id number]     Arrival date & time 06/23/15  2052 History   First MD Initiated Contact with Patient 06/23/15 2114     Chief Complaint  Patient presents with  . Chest Pain    times 2 days     (Consider location/radiation/quality/duration/timing/severity/associated sxs/prior Treatment) HPI Comments: Rebecca Coleman is a 19 y.o. female with a PMHx of anemia, who presents to the ED with complaints of chest pain 1 day while at rest which she describes as 2/10 intermittent central nonradiating sharp pain with no known aggravating or alleviating factors. She also reports a generalized headache which is similar to her prior headaches, mostly occurring when she stands up, with associated lightheadedness with standing and nausea. She reports that that began today, no thunderclap onset or most severe headache of her life. She denies any fevers, chills, diaphoresis, shortness of breath, orthopnea, cough, claudication, leg swelling, recent travel/surgery/immobilization, OCP use, history of DVT/PE, abdominal pain, vomiting, diarrhea, constipation, dysuria, hematuria, neck pain/stiffness, numbness, tingling, weakness, vision changes, or tinnitus. She is a nonsmoker with no cardiac history in herself or her family.  Patient is a 19 y.o. female presenting with chest pain. The history is provided by the patient. No language interpreter was used.  Chest Pain Pain location:  Substernal area Pain quality: sharp   Pain radiates to:  Does not radiate Pain radiates to the back: no   Pain severity:  Mild Onset quality:  Gradual Duration:  1 day Timing:  Intermittent Progression:  Unchanged Chronicity:  New Context: at rest   Relieved by:  None tried Worsened by:  Nothing tried Ineffective treatments:  None tried Associated symptoms: headache and nausea   Associated symptoms: no abdominal pain, no claudication, no cough, no diaphoresis, no fever, no lower extremity edema, no near-syncope, no numbness, no  orthopnea, no shortness of breath, no syncope, not vomiting and no weakness   Risk factors: obesity   Risk factors: no birth control, no coronary artery disease, no diabetes mellitus, no high cholesterol, no hypertension, no immobilization, no prior DVT/PE, no smoking and no surgery     Past Medical History  Diagnosis Date  . Anemia   . History of blood transfusion   . Medical history non-contributory    Past Surgical History  Procedure Laterality Date  . No past surgeries     Family History  Problem Relation Age of Onset  . Hypertension Sister   . Diabetes Maternal Aunt   . Cancer Mother   . Mental retardation Cousin    Social History  Substance Use Topics  . Smoking status: Never Smoker   . Smokeless tobacco: Never Used  . Alcohol Use: Yes   OB History    Gravida Para Term Preterm AB TAB SAB Ectopic Multiple Living   0              Review of Systems  Constitutional: Negative for fever, chills and diaphoresis.  Eyes: Negative for visual disturbance.  Respiratory: Negative for cough and shortness of breath.   Cardiovascular: Positive for chest pain. Negative for orthopnea, claudication, leg swelling, syncope and near-syncope.  Gastrointestinal: Positive for nausea. Negative for vomiting, abdominal pain, diarrhea and constipation.  Genitourinary: Negative for dysuria and hematuria.  Musculoskeletal: Negative for myalgias, arthralgias and neck pain.  Skin: Negative for color change.  Allergic/Immunologic: Negative for immunocompromised state.  Neurological: Positive for light-headedness (with standing) and headaches. Negative for weakness and numbness.  Psychiatric/Behavioral: Negative for confusion.   10 Systems reviewed and  are negative for acute change except as noted in the HPI.    Allergies  Shrimp  Home Medications   Prior to Admission medications   Medication Sig Start Date End Date Taking? Authorizing Provider  albuterol (PROVENTIL HFA;VENTOLIN HFA) 108  (90 BASE) MCG/ACT inhaler Inhale 2 puffs into the lungs every 6 (six) hours as needed for wheezing or shortness of breath.    Historical Provider, MD  amoxicillin (AMOXIL) 500 MG capsule Take 1 capsule (500 mg total) by mouth 3 (three) times daily. 06/07/14   Lesly Dukes, MD  ferrous sulfate 325 (65 FE) MG tablet Take 1 tablet (325 mg total) by mouth 3 (three) times daily with meals. 06/05/14   Adam Phenix, MD  HYDROcodone-homatropine [facility]) 5-1.5 MG/5ML syrup Take 5 mLs by mouth every 6 (six) hours as needed for cough. 05/28/15   Catha Gosselin, PA-C  Norethin-Eth Estradiol-Fe [facility] FE,WYMZYA Cecille [place]) 0.4-35 MG-MCG tablet Chew 1 tablet by mouth daily. 06/05/14   Adam Phenix, MD   BP 140/77 mmHg  Pulse 82  Temp(Src) 98.1 F (36.7 C)  Resp 18  Ht 5\' 11"  (1.803 m)  Wt 275 lb (124.739 kg)  BMI 38.37 kg/m2  SpO2 100%  LMP 05/14/2015 Physical Exam  Constitutional: She is oriented to person, place, and time. Vital signs are normal. She appears well-developed and well-nourished.  Non-toxic appearance. No distress.  Afebrile, nontoxic, NAD  HENT:  Head: Normocephalic and atraumatic.  Mouth/Throat: Oropharynx is clear and moist and mucous membranes are normal.  Eyes: Conjunctivae and EOM are normal. Pupils are equal, round, and reactive to light. Right eye exhibits no discharge. Left eye exhibits no discharge.  PERRL, EOMI, no nystagmus, no visual field deficits   Neck: Normal range of motion. Neck supple.  No meningismus  Cardiovascular: Normal rate, regular rhythm, normal heart sounds and intact distal pulses.  Exam reveals no gallop and no friction rub.   No murmur heard. RRR, nl s1/s2, no m/r/g, distal pulses intact, no pedal edema   Pulmonary/Chest: Effort normal and breath sounds normal. No respiratory distress. She has no decreased breath sounds. She has no wheezes. She has no rhonchi. She has no rales. She exhibits no tenderness, no crepitus, no deformity and no  retraction.  CTAB in all lung fields, no w/r/r, no hypoxia or increased WOB, speaking in full sentences, SpO2 100% on RA  No chest wall tenderness, crepitus, deformity, or retractions  Abdominal: Soft. Normal appearance and bowel sounds are normal. She exhibits no distension. There is no tenderness. There is no rigidity, no rebound, no guarding, no CVA tenderness, no tenderness at McBurney's point and negative Murphy's sign.  Musculoskeletal: Normal range of motion.  MAE x4 Strength and sensation grossly intact Distal pulses intact No pedal edema, neg homan's bilaterally  Gait steady  Neurological: She is alert and oriented to person, place, and time. She has normal strength. No cranial nerve deficit or sensory deficit. She displays a negative Romberg sign. Coordination and gait normal. GCS eye subscore is 4. GCS verbal subscore is 5. GCS motor subscore is 6.  CN 2-12 grossly intact A&O x4 GCS 15 Sensation and strength intact Gait nonataxic including with tandem walking Coordination with finger-to-nose WNL Neg romberg, neg pronator drift   Skin: Skin is warm, dry and intact. No rash noted.  Psychiatric: She has a normal mood and affect.  Nursing note and vitals reviewed.   ED Course  Procedures (including critical care time)  22:02:53 Orthostatic Vital Signs  KW  Orthostatic Lying  - BP- Lying: 137/85 mmHg ; Pulse- Lying: 86  Orthostatic Sitting - BP- Sitting: 141/77 mmHg ; Pulse- Sitting: 86  Orthostatic Standing at 0 minutes - BP- Standing at 0 minutes: 118/71 mmHg ; Pulse- Standing at 0 minutes: 91       Labs Review Labs Reviewed  BASIC METABOLIC PANEL - Abnormal; Notable for the following:    Glucose, Bld 100 (*)    All other components within normal limits  CBC - Abnormal; Notable for the following:    Hemoglobin 11.1 (*)    MCV 76.9 (*)    MCH 23.1 (*)    RDW 16.5 (*)    Platelets 473 (*)    All other components within normal limits  I-STAT BETA HCG BLOOD, ED ([facility],  [facility], [facility] ONLY)  I-STAT TROPOININ, ED    Imaging Review Dg Chest 2 View  06/23/2015  CLINICAL DATA:  Acute onset of left lower chest pain and central chest pain. Headache. Initial encounter. EXAM: CHEST  2 VIEW COMPARISON:  None. FINDINGS: The lungs are well-aerated and clear. There is no evidence of focal opacification, pleural effusion or pneumothorax. The heart is normal in size; the mediastinal contour is within normal limits. No acute osseous abnormalities are seen. IMPRESSION: No acute cardiopulmonary process seen. Electronically Signed   By: Roanna Raider M.D.   On: 06/23/2015 21:51   I have personally reviewed and evaluated these images and lab results as part of my medical decision-making.   EKG Interpretation   Date/Time:  Sunday June 23 2015 21:02:30 EDT Ventricular Rate:  88 PR Interval:  164 QRS Duration: 76 QT Interval:  351 QTC Calculation: 425 R Axis:   80 Text Interpretation:  Sinus rhythm Probable left atrial enlargement  Baseline wander in lead(s) V3 V4 V6 No significant change was found  Confirmed by Manus Gunning  MD, STEPHEN [phone]) on 06/23/2015 10:43:58 PM      MDM   Final diagnoses:  Atypical chest pain  Orthostatic lightheadedness  Nausea  Anemia, unspecified anemia type  Acute nonintractable headache, unspecified headache type    19 y.o. female here with CP x1 day, and headache/nausea/lightheadedness with standing. Headache is similar to prior headaches. No focal neuro deficits. No SOB, no hypoxia or tachycardia, doubt PE as source of her CP. Will obtain labs, CXR, EKG, and beta HCG, and will give migraine cocktail. Doubt SAH, CVA, or other etiology of headaches, doubt need for CT imaging. Will reassess shortly.   10:41 PM EKG nonischemic. Trop neg. BetaHCG neg. CBC showing baseline anemia. BMP WNL. CXR clear. Pain/nausea improved. HA resolved. Tolerating PO well. Orthostatic VS reveal +orthostasis, likely the culprit of her lightheadedness and possibly  of the headache as well. Will send home with reglan and naprosyn. F/up with PCP in 1wk, pt has a doctor's office she will be going to. Stay well hydrated. I explained the diagnosis and have given explicit precautions to return to the ER including for any other new or worsening symptoms. The patient understands and accepts the medical plan as it's been dictated and I have answered their questions. Discharge instructions concerning home care and prescriptions have been given. The patient is STABLE and is discharged to home in good condition.  BP 130/70 mmHg  Pulse 72  Temp(Src) 98.1 F (36.7 C)  Resp 11  Ht  (1.803 m)  Wt 275 lb (124.739 kg)  BMI 38.37 kg/m2  SpO2 99%  LMP 05/14/2015  Meds ordered this encounter  Medications  . metoCLOPramide (REGLAN) injection 10 mg    Sig:    And  . diphenhydrAMINE (BENADRYL) injection 25 mg    Sig:    And  . sodium chloride 0.9 % bolus 1,000 mL    Sig:    And  . ketorolac (TORADOL) 30 MG/ML injection 30 mg    Sig:   . metoCLOPramide (REGLAN) 10 MG tablet    Sig: Take 1 tablet (10 mg total) by mouth every 6 (six) hours as needed for nausea (nausea/headache).    Dispense:  6 tablet    Refill:  0    Order Specific Question:  Supervising Provider    Answer:  MILLER, BRIAN [[id number]]  . naproxen (NAPROSYN) 500 MG tablet    Sig: Take 1 tablet (500 mg total) by mouth 2 (two) times daily as needed for mild pain, moderate pain or headache (TAKE WITH MEALS.).    Dispense:  20 tablet    Refill:  0    Order Specific Question:  Supervising Provider    Answer:  Eber HongMILLER, BRIAN [[id number]]      Deanna Boehlke Camprubi-Soms, PA-C 06/23/15 2250  Glynn OctaveStephen Rancour, MD 06/23/15 2300

## 2015-06-23 NOTE — ED Notes (Signed)
Pt states she has a sharp pain/pressure in the center of her chest x 2 days. Pt states she also has dizziness. She has a history of hypertension and is prediabetic

## 2016-10-01 ENCOUNTER — Encounter (HOSPITAL_COMMUNITY): Payer: Self-pay | Admitting: Emergency Medicine

## 2016-10-01 ENCOUNTER — Ambulatory Visit (HOSPITAL_COMMUNITY)
Admission: EM | Admit: 2016-10-01 | Discharge: 2016-10-01 | Disposition: A | Discharge status: Home / Self Care | Payer: Managed Care, Other (non HMO) | Attending: Family Medicine | Admitting: Family Medicine

## 2016-10-01 DIAGNOSIS — L02412 Cutaneous abscess of left axilla: Secondary | ICD-10-CM | POA: Diagnosis not present

## 2016-10-01 MED ORDER — DOXYCYCLINE HYCLATE 100 MG PO TABS: 100.0000 mg | ORAL_TABLET | Freq: Two times a day (BID) | ORAL | 0 refills | Status: DC

## 2016-10-01 NOTE — ED Provider Notes (Signed)
[facility]    CSN: [id number] Arrival date & time: 10/01/16  1854     History   Chief Complaint Chief Complaint  Patient presents with  . Abscess    HPI Rebecca Coleman is a 21 y.o. female.   This is a 21 year old woman who comes into the [facility] urgent care center for evaluation of which she thinks is a left axillary abscess. She's had recurrent infections under each of her axillae. This was not particular a painful and has started to drain copious amounts of purulent debris.  Asian is sent here by her family because her mother has breast cancer      Past Medical History:  Diagnosis Date  . Anemia   . History of blood transfusion   . Medical history non-contributory     Patient Active Problem List   Diagnosis Date Noted  . Anemia 06/04/2014  . Menorrhagia 06/04/2014    Past Surgical History:  Procedure Laterality Date  . NO PAST SURGERIES      OB History    Gravida Para Term Preterm AB Living   0             SAB TAB Ectopic Multiple Live Births                   Home Medications    Prior to Admission medications   Medication Sig Start Date End Date Taking? Authorizing Provider  albuterol (PROVENTIL HFA;VENTOLIN HFA) 108 (90 BASE) MCG/ACT inhaler Inhale 2 puffs into the lungs every 6 (six) hours as needed for wheezing or shortness of breath.    Historical Provider, MD  doxycycline (VIBRA-TABS) 100 MG tablet Take 1 tablet (100 mg total) by mouth 2 (two) times daily. 10/01/16   Elvina Sidle, MD    Family History Family History  Problem Relation Age of Onset  . Hypertension Sister   . Diabetes Maternal Aunt   . Cancer Mother   . Mental retardation Cousin     Social History Social History  Substance Use Topics  . Smoking status: Never Smoker  . Smokeless tobacco: Never Used  . Alcohol use Yes     Allergies   Shrimp [shellfish allergy]   Review of Systems Review of Systems  Constitutional: Negative.     Skin: Positive for wound.     Physical Exam Triage Vital Signs ED Triage Vitals [10/01/16 1915]  Enc Vitals Group     BP 120/64     Pulse Rate 106     Resp 16     Temp 98.1 F (36.7 C)     Temp Source Oral     SpO2 100 %     Weight      Height      Head Circumference      Peak Flow      Pain Score 5     Pain Loc      Pain Edu?      Excl. in GC?    No data found.   Updated Vital Signs BP 120/64 (BP Location: Left Arm)   Pulse 106   Temp 98.1 F (36.7 C) (Oral)   Resp 16   LMP  (LMP Unknown)   SpO2 100%    Physical Exam  Constitutional: She is oriented to person, place, and time. She appears well-developed and well-nourished.  HENT:  Head: Normocephalic.  Right Ear: External ear normal.  Left Ear: External ear normal.  Mouth/Throat: Oropharynx is clear  and moist.  Eyes: Conjunctivae are normal. Pupils are equal, round, and reactive to light.  Neck: Normal range of motion. Neck supple.  Pulmonary/Chest: Effort normal.  Musculoskeletal: Normal range of motion.  Neurological: She is alert and oriented to person, place, and time.  Skin: Skin is warm.  Thick purulent drainage from left axilla  Nursing note and vitals reviewed.    UC Treatments / Results  Labs (all labs ordered are listed, but only abnormal results are displayed) Labs Reviewed - No data to display  EKG  EKG Interpretation None       Radiology No results found.  Procedures Procedures (including critical care time)  Medications Ordered in UC Medications - No data to display   Initial Impression / Assessment and Plan / UC Course  I have reviewed the triage vital signs and the nursing notes.  Pertinent labs & imaging results that were available during my care of the patient were reviewed by me and considered in my medical decision making (see chart for details).     Final Clinical Impressions(s) / UC Diagnoses   Final diagnoses:  Abscess of left axilla    New  Prescriptions New Prescriptions   DOXYCYCLINE (VIBRA-TABS) 100 MG TABLET    Take 1 tablet (100 mg total) by mouth 2 (two) times daily.     Elvina SidleKurt Sahiba Granholm, MD 10/01/16 [phone]

## 2016-10-01 NOTE — ED Triage Notes (Signed)
Here for abscess on left axilla onset today  Sx include pain and swelling  Denies drainage, fevers  A&o x4... NAD

## 2016-12-03 ENCOUNTER — Ambulatory Visit (HOSPITAL_COMMUNITY)
Admission: EM | Admit: 2016-12-03 | Discharge: 2016-12-03 | Disposition: A | Discharge status: Home / Self Care | Payer: Managed Care, Other (non HMO) | Attending: Family Medicine | Admitting: Family Medicine

## 2016-12-03 ENCOUNTER — Encounter (HOSPITAL_COMMUNITY): Payer: Self-pay | Admitting: Emergency Medicine

## 2016-12-03 DIAGNOSIS — R197 Diarrhea, unspecified: Secondary | ICD-10-CM

## 2016-12-03 DIAGNOSIS — R110 Nausea: Secondary | ICD-10-CM

## 2016-12-03 HISTORY — DX: Type 2 diabetes mellitus without complications: E11.9

## 2016-12-03 MED ORDER — ONDANSETRON 8 MG PO TBDP: 8.0000 mg | ORAL_TABLET | Freq: Three times a day (TID) | ORAL | 0 refills | Status: DC | PRN

## 2016-12-03 NOTE — ED Triage Notes (Signed)
PT was started on metaglip 4 weeks ago, unaware of dose. PT reports diarrhea for one week with nausea. No vomiting. PT reports diarrhea 4-5 times per day. PT reports she is urinating regularly.

## 2016-12-03 NOTE — ED Provider Notes (Signed)
CSN: [id number]     Arrival date & time 12/03/16  1821 History   First MD Initiated Contact with Patient 12/03/16 1922     Chief Complaint  Patient presents with  . Diarrhea   (Consider location/radiation/quality/duration/timing/severity/associated sxs/prior Treatment) Patient started metglip diabetes medicine and she is experiencing diarrhea and nausea.   The history is provided by the patient.  Diarrhea  Quality:  Watery Severity:  Moderate Duration:  3 days Timing:  Constant Relieved by:  Nothing Worsened by:  Nothing Ineffective treatments:  None tried   Past Medical History:  Diagnosis Date  . Anemia   . Diabetes mellitus without complication (HCC)   . History of blood transfusion   . Medical history non-contributory    Past Surgical History:  Procedure Laterality Date  . NO PAST SURGERIES     Family History  Problem Relation Age of Onset  . Hypertension Sister   . Diabetes Maternal Aunt   . Cancer Mother   . Mental retardation Cousin    Social History  Substance Use Topics  . Smoking status: Never Smoker  . Smokeless tobacco: Never Used  . Alcohol use Yes     Comment: rarely   OB History    Gravida Para Term Preterm AB Living   0             SAB TAB Ectopic Multiple Live Births                 Review of Systems  Constitutional: Negative.   HENT: Negative.   Eyes: Negative.   Respiratory: Negative.   Cardiovascular: Negative.   Gastrointestinal: Positive for diarrhea.  Endocrine: Negative.   Genitourinary: Negative.   Musculoskeletal: Negative.   Allergic/Immunologic: Negative.   Neurological: Negative.   Hematological: Negative.   Psychiatric/Behavioral: Negative.     Allergies  Shrimp [shellfish allergy]  Home Medications   Prior to Admission medications   Medication Sig Start Date End Date Taking? Authorizing Provider  albuterol (PROVENTIL HFA;VENTOLIN HFA) 108 (90 BASE) MCG/ACT inhaler Inhale 2 puffs into the lungs every 6 (six)  hours as needed for wheezing or shortness of breath.    Historical Provider, MD  doxycycline (VIBRA-TABS) 100 MG tablet Take 1 tablet (100 mg total) by mouth 2 (two) times daily. 10/01/16   Elvina Sidle, MD  ondansetron (ZOFRAN ODT) 8 MG disintegrating tablet Take 1 tablet (8 mg total) by mouth every 8 (eight) hours as needed for nausea or vomiting. 12/03/16   Deatra Canter, FNP   Meds Ordered and Administered this Visit  Medications - No data to display  BP (!) 154/98 (BP Location: Right Arm)   Pulse 91   Temp 98.1 F (36.7 C) (Oral)   Resp 16   Ht 6' (1.829 m)   Wt 279 lb (126.6 kg)   SpO2 100%   BMI 37.84 kg/m  No data found.   Physical Exam  Constitutional: She is oriented to person, place, and time. She appears well-developed and well-nourished.  HENT:  Head: Normocephalic and atraumatic.  Right Ear: External ear normal.  Left Ear: External ear normal.  Mouth/Throat: Oropharynx is clear and moist.  Eyes: Conjunctivae and EOM are normal. Pupils are equal, round, and reactive to light.  Neck: Normal range of motion. Neck supple.  Cardiovascular: Normal rate, regular rhythm and normal heart sounds.   Pulmonary/Chest: Effort normal and breath sounds normal.  Abdominal: Soft.  Musculoskeletal: Normal range of motion.  Neurological: She is alert and oriented to  person, place, and time.  Nursing note and vitals reviewed.   Urgent Care Course     Procedures (including critical care time)  Labs Review Labs Reviewed - No data to display  Imaging Review No results found.   Visual Acuity Review  Right Eye Distance:   Left Eye Distance:   Bilateral Distance:    Right Eye Near:   Left Eye Near:    Bilateral Near:         MDM   1. Nausea   2. Diarrhea in adult patient    Zofran ODT  one po tid prn  Take otc pro biotics for diarrhea  Follow up with PCP     Deatra Canter, FNP 12/03/16 1957    Deatra Canter, FNP 12/03/16 2034

## 2016-12-03 NOTE — Discharge Instructions (Signed)
Follow up with PCP next week for diarrhea and nausea.  This is probably a side effect of diabetes medicine and would not stop diabetes medicine at this point.

## 2017-03-16 ENCOUNTER — Emergency Department (HOSPITAL_COMMUNITY)
Admission: EM | Admit: 2017-03-16 | Discharge: 2017-03-17 | Disposition: A | Discharge status: Home / Self Care | Payer: Managed Care, Other (non HMO) | Attending: Emergency Medicine | Admitting: Emergency Medicine

## 2017-03-16 ENCOUNTER — Emergency Department (HOSPITAL_COMMUNITY): Payer: Managed Care, Other (non HMO)

## 2017-03-16 ENCOUNTER — Other Ambulatory Visit: Payer: Self-pay

## 2017-03-16 ENCOUNTER — Encounter (HOSPITAL_COMMUNITY): Payer: Self-pay | Admitting: Emergency Medicine

## 2017-03-16 DIAGNOSIS — R079 Chest pain, unspecified: Secondary | ICD-10-CM | POA: Diagnosis not present

## 2017-03-16 DIAGNOSIS — E119 Type 2 diabetes mellitus without complications: Secondary | ICD-10-CM | POA: Diagnosis not present

## 2017-03-16 DIAGNOSIS — Z7984 Long term (current) use of oral hypoglycemic drugs: Secondary | ICD-10-CM | POA: Diagnosis not present

## 2017-03-16 LAB — CBC
HCT: 39 % (ref 36.0–46.0)
Hemoglobin: 12.7 g/dL (ref 12.0–15.0)
MCH: 26.6 pg (ref 26.0–34.0)
MCHC: 32.6 g/dL (ref 30.0–36.0)
MCV: 81.6 fL (ref 78.0–100.0)
PLATELETS: 418 10*3/uL — AB (ref 150–400)
RBC: 4.78 MIL/uL (ref 3.87–5.11)
RDW: 14.5 % (ref 11.5–15.5)
WBC: 8.1 10*3/uL (ref 4.0–10.5)

## 2017-03-16 LAB — POCT I-STAT TROPONIN I: TROPONIN I, POC: 0 ng/mL (ref 0.00–0.08)

## 2017-03-16 LAB — BASIC METABOLIC PANEL
Anion gap: 8 (ref 5–15)
BUN: 11 mg/dL (ref 6–20)
CALCIUM: 9.3 mg/dL (ref 8.9–10.3)
CO2: 29 mmol/L (ref 22–32)
Chloride: 101 mmol/L (ref 101–111)
Creatinine, Ser: 1.02 mg/dL — ABNORMAL HIGH (ref 0.44–1.00)
GFR calc Af Amer: >60 mL/min (ref >60)
GLUCOSE: 106 mg/dL — AB (ref 65–99)
Potassium: 4 mmol/L (ref 3.5–5.1)
Sodium: 138 mmol/L (ref 135–145)

## 2017-03-16 NOTE — ED Triage Notes (Signed)
Pt from home with c/o central CP that began around 1300 today. Pt denies radiation of pain. Pt denies recent illness or heavy lifting. Pt rates pain 7/10. Pt denies SOB or lightheadedness. Pt states holding pressure to her chest makes her symptoms better.

## 2017-03-17 LAB — HEMOGLOBIN AND HEMATOCRIT, BLOOD
HCT: 37.4 % (ref 36.0–46.0)
Hemoglobin: 12 g/dL (ref 12.0–15.0)

## 2017-03-17 MED ORDER — SUCRALFATE 1 GM/10ML PO SUSP: 1.0000 g | Freq: Three times a day (TID) | ORAL | 0 refills | Status: DC

## 2017-03-17 MED ORDER — GI COCKTAIL ~~LOC~~
30.0000 mL | Freq: Once | ORAL | Status: AC
  Administered 2017-03-17: 30 mL via ORAL
  Filled 2017-03-17: qty 30

## 2017-03-17 MED ORDER — OMEPRAZOLE 20 MG PO CPDR: 20.0000 mg | DELAYED_RELEASE_CAPSULE | Freq: Every day | ORAL | 0 refills | Status: DC

## 2017-03-17 NOTE — Discharge Instructions (Signed)
[facility] or with a primary care provider of your choice for recheck if symptoms are ongoing. Return to the emergency department with any new or concerning symptoms.

## 2017-03-17 NOTE — ED Provider Notes (Signed)
[facility]-EMERGENCY DEPT Provider Note   CSN: [id number] Arrival date & time: 03/16/17  2034     History   Chief Complaint Chief Complaint  Patient presents with  . Chest Pain    HPI Rebecca Coleman is a 21 y.o. female.  Patient presents with complaint of central chest pain described as sharp, stabbing. It started earlier today and has been constant. No modifying factors. There has been no SOB, nausea or diaphoresis. She has had similar pain in the past infrequently and reports it has been known to radiate to the back. No cough or fever. She has not tried taking anything for symptoms.     The history is provided by the patient. No language interpreter was used.  Chest Pain   Pertinent negatives include no cough, no diaphoresis, no fever, no nausea, no shortness of breath and no vomiting.    Past Medical History:  Diagnosis Date  . Anemia   . Diabetes mellitus without complication (HCC)   . History of blood transfusion   . Medical history non-contributory     Patient Active Problem List   Diagnosis Date Noted  . Anemia 06/04/2014  . Menorrhagia 06/04/2014    Past Surgical History:  Procedure Laterality Date  . NO PAST SURGERIES      OB History    Gravida Para Term Preterm AB Living   0             SAB TAB Ectopic Multiple Live Births                   Home Medications    Prior to Admission medications   Medication Sig Start Date End Date Taking? Authorizing Provider  albuterol (PROVENTIL HFA;VENTOLIN HFA) 108 (90 BASE) MCG/ACT inhaler Inhale 2 puffs into the lungs every 6 (six) hours as needed for wheezing or shortness of breath.   Yes [provider]  METFORMIN HCL PO Take 2 tablets by mouth at bedtime.   Yes [provider]  doxycycline (VIBRA-TABS) 100 MG tablet Take 1 tablet (100 mg total) by mouth 2 (two) times daily. Patient not taking: Reported on 03/17/2017 10/01/16   Elvina Sidle, MD  ondansetron (ZOFRAN ODT) 8 MG disintegrating  tablet Take 1 tablet (8 mg total) by mouth every 8 (eight) hours as needed for nausea or vomiting. Patient not taking: Reported on 03/17/2017 12/03/16   Deatra Canter, FNP    Family History Family History  Problem Relation Age of Onset  . Hypertension Sister   . Diabetes Maternal Aunt   . Cancer Mother   . Mental retardation Cousin     Social History Social History  Substance Use Topics  . Smoking status: Never Smoker  . Smokeless tobacco: Never Used  . Alcohol use Yes     Comment: rarely     Allergies   Shrimp [shellfish allergy]   Review of Systems Review of Systems  Constitutional: Negative for chills, diaphoresis and fever.  Respiratory: Negative.  Negative for cough and shortness of breath.   Cardiovascular: Positive for chest pain.  Gastrointestinal: Negative.  Negative for nausea and vomiting.  Musculoskeletal: Negative.   Neurological: Negative.      Physical Exam Updated Vital Signs BP (!) 144/91 (BP Location: Right Arm)   Pulse 88   Temp 98.2 F (36.8 C) (Oral)   Resp 12   Ht 6' (1.829 m)   Wt 126.6 kg (279 lb)   LMP 09/16/2016   SpO2 100%   BMI 37.84  kg/m   Physical Exam  Constitutional: She is oriented to person, place, and time. She appears well-developed and well-nourished.  HENT:  Head: Normocephalic.  Neck: Normal range of motion. Neck supple.  Cardiovascular: Normal rate and regular rhythm.   No murmur heard. Pulmonary/Chest: Effort normal and breath sounds normal. She has no wheezes. She has no rales. She exhibits tenderness (Mild central tenderness).  Abdominal: Soft. Bowel sounds are normal. There is no tenderness. There is no rebound and no guarding.  Musculoskeletal: Normal range of motion.  Neurological: She is alert and oriented to person, place, and time.  Skin: Skin is warm and dry. No rash noted.  Psychiatric: She has a normal mood and affect.     ED Treatments / Results  Labs (all labs ordered are listed, but only  abnormal results are displayed) Labs Reviewed  BASIC METABOLIC PANEL - Abnormal; Notable for the following:       Result Value   Glucose, Bld 106 (*)    Creatinine, Ser 1.02 (*)    All other components within normal limits  CBC - Abnormal; Notable for the following:    Platelets 418 (*)    All other components within normal limits  HEMOGLOBIN AND HEMATOCRIT, BLOOD  I-STAT TROPOININ, ED  POCT I-STAT TROPONIN I   Results for orders placed or performed during the hospital encounter of 03/16/17  Basic metabolic panel  Result Value Ref Range   Sodium 138 135 - 145 mmol/L   Potassium 4.0 3.5 - 5.1 mmol/L   Chloride 101 101 - 111 mmol/L   CO2 29 22 - 32 mmol/L   Glucose, Bld 106 (H) 65 - 99 mg/dL   BUN 11 6 - 20 mg/dL   Creatinine, Ser 4.091.02 (H) 0.44 - 1.00 mg/dL   Calcium 9.3 8.9 - 81.110.3 mg/dL   GFR calc non Af Amer >60 >60 mL/min   GFR calc Af Amer >60 >60 mL/min   Anion gap 8 5 - 15  CBC  Result Value Ref Range   WBC 8.1 4.0 - 10.5 K/uL   RBC 4.78 3.87 - 5.11 MIL/uL   Hemoglobin 12.7 12.0 - 15.0 g/dL   HCT 91.439.0 78.236.0 - 95.646.0 %   MCV 81.6 78.0 - 100.0 fL   MCH 26.6 26.0 - 34.0 pg   MCHC 32.6 30.0 - 36.0 g/dL   RDW 21.314.5 08.611.5 - 57.815.5 %   Platelets 418 (H) 150 - 400 K/uL  Hemoglobin and hematocrit, blood  Result Value Ref Range   Hemoglobin 12.0 12.0 - 15.0 g/dL   HCT 46.937.4 62.936.0 - 52.846.0 %  POCT i-Stat troponin I  Result Value Ref Range   Troponin i, poc 0.00 0.00 - 0.08 ng/mL   Comment 3          EKG  EKG Interpretation None       Radiology Dg Chest 2 View  Result Date: 03/16/2017 CLINICAL DATA:  Central chest pain for several hours EXAM: CHEST  2 VIEW COMPARISON:  06/23/2015 FINDINGS: The heart size and mediastinal contours are within normal limits. Both lungs are clear. The visualized skeletal structures are unremarkable. IMPRESSION: No active cardiopulmonary disease. Electronically Signed   By: Alcide CleverMark  Lukens M.D.   On: 03/16/2017 21:13    Procedures Procedures  (including critical care time)  Medications Ordered in ED Medications  gi cocktail (Maalox,Lidocaine,Donnatal) (30 mLs Oral Given 03/17/17 0031)     Initial Impression / Assessment and Plan / ED Course  I have reviewed the  triage vital signs and the nursing notes.  Pertinent labs & imaging results that were available during my care of the patient were reviewed by me and considered in my medical decision making (see chart for details).     Patient here with chest pain that is sharp and constant. Atypical for cardiac chest pain in this 21 yo patient. She describes pain that sometimes radiates to the back. Today pain came on right after lunch. Suspect GI component. Doubt cardiac etiology.  Will start on carafate and Prilosec. Recommended PCP follow up.  Final Clinical Impressions(s) / ED Diagnoses   Final diagnoses:  None   1. Nonspecific chest pain 2. Reflux   New Prescriptions New Prescriptions   No medications on file     Danne Harbor 03/17/17 Cristal Deer, MD 03/19/17 [phone]

## 2017-03-17 NOTE — ED Notes (Signed)
Late doc assessment done at 4123

## 2017-08-26 ENCOUNTER — Encounter (HOSPITAL_COMMUNITY): Payer: Self-pay | Admitting: *Deleted

## 2017-08-26 ENCOUNTER — Other Ambulatory Visit: Payer: Self-pay

## 2017-08-26 ENCOUNTER — Emergency Department (HOSPITAL_COMMUNITY)
Admission: EM | Admit: 2017-08-26 | Discharge: 2017-08-26 | Disposition: A | Discharge status: Home / Self Care | Payer: Managed Care, Other (non HMO) | Attending: Emergency Medicine | Admitting: Emergency Medicine

## 2017-08-26 DIAGNOSIS — M549 Dorsalgia, unspecified: Secondary | ICD-10-CM | POA: Insufficient documentation

## 2017-08-26 DIAGNOSIS — Z5321 Procedure and treatment not carried out due to patient leaving prior to being seen by health care provider: Secondary | ICD-10-CM | POA: Insufficient documentation

## 2017-08-26 DIAGNOSIS — M25519 Pain in unspecified shoulder: Secondary | ICD-10-CM | POA: Insufficient documentation
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
## 2017-08-26 NOTE — ED Notes (Signed)
No answer when called for room assignment x 3.

## 2017-08-27 ENCOUNTER — Emergency Department (HOSPITAL_COMMUNITY)
Admission: EM | Admit: 2017-08-27 | Discharge: 2017-08-27 | Disposition: A | Discharge status: Home / Self Care | Payer: No Typology Code available for payment source | Attending: Emergency Medicine | Admitting: Emergency Medicine

## 2017-08-27 ENCOUNTER — Encounter (HOSPITAL_COMMUNITY): Payer: Self-pay | Admitting: Emergency Medicine

## 2017-08-27 DIAGNOSIS — Y92410 Unspecified street and highway as the place of occurrence of the external cause: Secondary | ICD-10-CM | POA: Diagnosis not present

## 2017-08-27 DIAGNOSIS — M25511 Pain in right shoulder: Secondary | ICD-10-CM | POA: Insufficient documentation

## 2017-08-27 DIAGNOSIS — M545 Low back pain: Secondary | ICD-10-CM | POA: Insufficient documentation

## 2017-08-27 DIAGNOSIS — Y939 Activity, unspecified: Secondary | ICD-10-CM | POA: Diagnosis not present

## 2017-08-27 DIAGNOSIS — Y999 Unspecified external cause status: Secondary | ICD-10-CM | POA: Insufficient documentation

## 2017-08-27 DIAGNOSIS — M25512 Pain in left shoulder: Secondary | ICD-10-CM | POA: Diagnosis not present

## 2017-08-27 MED ORDER — IBUPROFEN 600 MG PO TABS: 600.0000 mg | ORAL_TABLET | Freq: Four times a day (QID) | ORAL | 0 refills | Status: DC | PRN

## 2017-08-27 MED ORDER — CYCLOBENZAPRINE HCL 10 MG PO TABS: 10.0000 mg | ORAL_TABLET | Freq: Two times a day (BID) | ORAL | 0 refills | Status: DC | PRN
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
## 2017-08-27 NOTE — ED Notes (Signed)
Bed: WTR7 Expected date:  Expected time:  Means of arrival:  Comments: 
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
## 2019-04-06 ENCOUNTER — Other Ambulatory Visit: Payer: Self-pay

## 2019-04-06 DIAGNOSIS — Z20822 Contact with and (suspected) exposure to covid-19: Secondary | ICD-10-CM

## 2019-04-08 LAB — NOVEL CORONAVIRUS, NAA: SARS-CoV-2, NAA: NOT DETECTED

## 2019-04-08 LAB — SPECIMEN STATUS REPORT

## 2019-04-28 ENCOUNTER — Other Ambulatory Visit: Payer: Self-pay

## 2019-04-28 DIAGNOSIS — Z20822 Contact with and (suspected) exposure to covid-19: Secondary | ICD-10-CM

## 2019-04-30 LAB — NOVEL CORONAVIRUS, NAA: SARS-CoV-2, NAA: NOT DETECTED

## 2019-06-06 ENCOUNTER — Other Ambulatory Visit: Payer: Self-pay

## 2019-06-06 DIAGNOSIS — Z20822 Contact with and (suspected) exposure to covid-19: Secondary | ICD-10-CM

## 2019-06-08 LAB — NOVEL CORONAVIRUS, NAA: SARS-CoV-2, NAA: NOT DETECTED

## 2019-06-15 ENCOUNTER — Other Ambulatory Visit: Payer: Self-pay

## 2019-06-15 DIAGNOSIS — Z20822 Contact with and (suspected) exposure to covid-19: Secondary | ICD-10-CM

## 2019-06-17 LAB — NOVEL CORONAVIRUS, NAA: SARS-CoV-2, NAA: NOT DETECTED

## 2019-08-22 ENCOUNTER — Ambulatory Visit: Payer: HRSA Program | Attending: Internal Medicine

## 2019-08-22 DIAGNOSIS — Z20822 Contact with and (suspected) exposure to covid-19: Secondary | ICD-10-CM

## 2019-08-22 DIAGNOSIS — Z20828 Contact with and (suspected) exposure to other viral communicable diseases: Secondary | ICD-10-CM | POA: Insufficient documentation

## 2019-08-23 LAB — NOVEL CORONAVIRUS, NAA: SARS-CoV-2, NAA: NOT DETECTED

## 2020-02-12 ENCOUNTER — Emergency Department: Payer: No Typology Code available for payment source

## 2020-02-12 ENCOUNTER — Emergency Department
Admission: EM | Admit: 2020-02-12 | Discharge: 2020-02-12 | Disposition: A | Discharge status: Home / Self Care | Payer: No Typology Code available for payment source | Attending: Emergency Medicine | Admitting: Emergency Medicine

## 2020-02-12 ENCOUNTER — Other Ambulatory Visit: Payer: Self-pay

## 2020-02-12 ENCOUNTER — Encounter: Payer: Self-pay | Admitting: Emergency Medicine

## 2020-02-12 DIAGNOSIS — R739 Hyperglycemia, unspecified: Secondary | ICD-10-CM

## 2020-02-12 DIAGNOSIS — E1165 Type 2 diabetes mellitus with hyperglycemia: Secondary | ICD-10-CM | POA: Insufficient documentation

## 2020-02-12 DIAGNOSIS — Z7984 Long term (current) use of oral hypoglycemic drugs: Secondary | ICD-10-CM | POA: Insufficient documentation

## 2020-02-12 DIAGNOSIS — I10 Essential (primary) hypertension: Secondary | ICD-10-CM | POA: Insufficient documentation

## 2020-02-12 DIAGNOSIS — R202 Paresthesia of skin: Secondary | ICD-10-CM | POA: Diagnosis not present

## 2020-02-12 HISTORY — DX: Essential (primary) hypertension: I10

## 2020-02-12 LAB — COMPREHENSIVE METABOLIC PANEL
ALT: 30 U/L (ref 0–44)
AST: 25 U/L (ref 15–41)
Albumin: 3.9 g/dL (ref 3.5–5.0)
Alkaline Phosphatase: 83 U/L (ref 38–126)
Anion gap: 10 (ref 5–15)
BUN: 8 mg/dL (ref 6–20)
CO2: 26 mmol/L (ref 22–32)
Calcium: 8.8 mg/dL — ABNORMAL LOW (ref 8.9–10.3)
Chloride: 99 mmol/L (ref 98–111)
Creatinine, Ser: 0.91 mg/dL (ref 0.44–1.00)
GFR calc Af Amer: >60 mL/min (ref >60)
GFR calc non Af Amer: >60 mL/min (ref >60)
Glucose, Bld: 305 mg/dL — ABNORMAL HIGH (ref 70–99)
Potassium: 4 mmol/L (ref 3.5–5.1)
Sodium: 135 mmol/L (ref 135–145)
Total Bilirubin: 0.4 mg/dL (ref 0.3–1.2)
Total Protein: 7.4 g/dL (ref 6.5–8.1)

## 2020-02-12 LAB — DIFFERENTIAL
Abs Immature Granulocytes: 0.02 10*3/uL (ref 0.00–0.07)
Basophils Absolute: 0 10*3/uL (ref 0.0–0.1)
Basophils Relative: 0 %
Eosinophils Absolute: 0.1 10*3/uL (ref 0.0–0.5)
Eosinophils Relative: 1 %
Immature Granulocytes: 0 %
Lymphocytes Relative: 38 %
Lymphs Abs: 2.2 10*3/uL (ref 0.7–4.0)
Monocytes Absolute: 0.4 10*3/uL (ref 0.1–1.0)
Monocytes Relative: 7 %
Neutro Abs: 3 10*3/uL (ref 1.7–7.7)
Neutrophils Relative %: 54 %

## 2020-02-12 LAB — CBC
HCT: 39.8 % (ref 36.0–46.0)
Hemoglobin: 13 g/dL (ref 12.0–15.0)
MCH: 26.9 pg (ref 26.0–34.0)
MCHC: 32.7 g/dL (ref 30.0–36.0)
MCV: 82.2 fL (ref 80.0–100.0)
Platelets: 351 10*3/uL (ref 150–400)
RBC: 4.84 MIL/uL (ref 3.87–5.11)
RDW: 13.3 % (ref 11.5–15.5)
WBC: 5.7 10*3/uL (ref 4.0–10.5)
nRBC: 0 % (ref 0.0–0.2)

## 2020-02-12 LAB — GLUCOSE, CAPILLARY: Glucose-Capillary: 256 mg/dL — ABNORMAL HIGH (ref 70–99)

## 2020-02-12 LAB — PROTIME-INR
INR: 1 (ref 0.8–1.2)
Prothrombin Time: 12.6 seconds (ref 11.4–15.2)

## 2020-02-12 LAB — APTT: aPTT: 27 seconds (ref 24–36)

## 2020-02-12 LAB — POCT PREGNANCY, URINE: Preg Test, Ur: NEGATIVE

## 2020-02-12 MED ORDER — SODIUM CHLORIDE 0.9 % IV BOLUS
1000.0000 mL | Freq: Once | INTRAVENOUS | Status: AC
  Administered 2020-02-12: 1000 mL via INTRAVENOUS

## 2020-02-12 NOTE — ED Triage Notes (Addendum)
Patient to ER for c/o tingling to left 4th and 5th whole fingers and finger tips to 3rd left finger as well. Patient states she noticed tingling to left lower face and lip upon waking. Patient denies headache. Speech clear, no facial droop present. Denies feeling weak on one side. Grips equal and strong. Patient is nonsmoker, denies taking birth control.

## 2020-02-12 NOTE — ED Provider Notes (Signed)
[facility] Emergency Department Provider Note ____________________________________________   First MD Initiated Contact with Patient 02/12/20 [phone]     (approximate)  I have reviewed the triage vital signs and the nursing notes.   HISTORY  Chief Complaint Tingling face and fingers    HPI Rebecca Coleman is a 24 y.o. female with PMH as noted below including hypertension and diabetes (on Metformin) who presents with tingling and altered sensation to her left hand and left lower lip when she awoke this morning around 5:30 AM.  The symptoms have improved but are still there.  She denies any prior history of this.  She states that she was asymptomatic when she went to bed last night.  She reports that the tingling is worst in the fourth and fifth digits on the left, and she also feels that in the fingertips on the second and third digits.  She denies any weakness.  Past Medical History:  Diagnosis Date  . Anemia   . Diabetes mellitus without complication ([place])   . History of blood transfusion   . Hypertension   . Medical history non-contributory     Patient Active Problem List   Diagnosis Date Noted  . Anemia 06/04/2014  . Menorrhagia 06/04/2014    Past Surgical History:  Procedure Laterality Date  . NO PAST SURGERIES      Prior to Admission medications   Medication Sig Start Date End Date Taking? Authorizing Provider  cyclobenzaprine (FLEXERIL) 10 MG tablet Take 1 tablet (10 mg total) by mouth 2 (two) times daily as needed for muscle spasms. 08/27/17   Domenic Moras, PA-C  doxycycline (VIBRA-TABS) 100 MG tablet Take 1 tablet (100 mg total) by mouth 2 (two) times daily. Patient not taking: Reported on 03/17/2017 10/01/16   Robyn Haber, MD  ibuprofen (ADVIL,MOTRIN) 200 MG tablet Take 400 mg by mouth daily as needed for headache (migraine).    [provider]  ibuprofen (ADVIL,MOTRIN) 600 MG tablet Take 1 tablet (600 mg total) by mouth every 6  (six) hours as needed. 08/27/17   Domenic Moras, PA-C  omeprazole (PRILOSEC) 20 MG capsule Take 1 capsule (20 mg total) by mouth daily. Patient not taking: Reported on 08/27/2017 03/17/17   Charlann Lange, PA-C  ondansetron (ZOFRAN ODT) 8 MG disintegrating tablet Take 1 tablet (8 mg total) by mouth every 8 (eight) hours as needed for nausea or vomiting. Patient not taking: Reported on 03/17/2017 12/03/16   Lysbeth Penner, FNP  sucralfate (CARAFATE) 1 GM/10ML suspension Take 10 mLs (1 g total) by mouth 4 (four) times daily -  with meals and at bedtime. Patient not taking: Reported on 08/27/2017 03/17/17   Charlann Lange, PA-C    Allergies Shrimp [shellfish allergy]  Family History  Problem Relation Age of Onset  . Hypertension Sister   . Diabetes Maternal Aunt   . Cancer Mother   . Mental retardation Cousin     Social History Social History   Tobacco Use  . Smoking status: Never Smoker  . Smokeless tobacco: Never Used  Vaping Use  . Vaping Use: Never used  Substance Use Topics  . Alcohol use: Yes    Comment: rarely  . Drug use: Yes    Types: Marijuana    Review of Systems  Constitutional: No fever. Eyes: No visual changes. ENT: No sore throat. Cardiovascular: Denies chest pain. Respiratory: Denies shortness of breath. Gastrointestinal: No vomiting or diarrhea.  Genitourinary: Negative for dysuria.  Musculoskeletal: Negative for back pain. Skin:  Negative for rash. Neurological: Negative for headache.  Positive for tingling/numbness.   ____________________________________________   PHYSICAL EXAM:  VITAL SIGNS: ED Triage Vitals [02/12/20 0859]  Enc Vitals Group     BP (!) 143/73     Pulse Rate (!) 107     Resp 20     Temp 98.6 F (37 C)     Temp Source Oral     SpO2 98 %     Weight (!) 302 lb (137 kg)     Height 5\' 10"  (1.778 m)     Head Circumference      Peak Flow      Pain Score 0     Pain Loc      Pain Edu?      Excl. in GC?     Constitutional:  Alert and oriented. Well appearing and in no acute distress. Eyes: Conjunctivae are normal.  Head: Atraumatic. Nose: No congestion/rhinnorhea. Mouth/Throat: Mucous membranes are moist.   Neck: Normal range of motion.  Cardiovascular: Normal rate, regular rhythm.  Good peripheral circulation. Respiratory: Normal respiratory effort.  No retractions.  Gastrointestinal: No distention.  Musculoskeletal: Extremities warm and well perfused.  Neurologic:  Normal speech and language.  5/5 motor strength and intact sensation to all extremities.  No ataxia.  No pronator drift.  No facial droop.  Subjective decreased sensation to left lower lip and ulnar distribution on the left hand. Skin:  Skin is warm and dry. No rash noted. Psychiatric: Mood and affect are normal. Speech and behavior are normal.  ____________________________________________   LABS (all labs ordered are listed, but only abnormal results are displayed)  Labs Reviewed  COMPREHENSIVE METABOLIC PANEL - Abnormal; Notable for the following components:      Result Value   Glucose, Bld 305 (*)    Calcium 8.8 (*)    All other components within normal limits  GLUCOSE, CAPILLARY - Abnormal; Notable for the following components:   Glucose-Capillary 256 (*)    All other components within normal limits  PROTIME-INR  APTT  CBC  DIFFERENTIAL  POC URINE PREG, ED  POCT PREGNANCY, URINE   ____________________________________________  EKG  ED ECG REPORT I, , the attending physician, personally viewed and interpreted this ECG.  Date: 02/12/2020 EKG Time: 0859 Rate: 104 Rhythm: Sinus tachycardia QRS Axis: normal Intervals: normal ST/T Wave abnormalities: normal Narrative Interpretation: no evidence of acute ischemia  ____________________________________________  RADIOLOGY  CT head: No acute abnormality MR brain diffusion: No acute  abnormality ____________________________________________   PROCEDURES  Procedure(s) performed: No  Procedures  Critical Care performed: No ____________________________________________   INITIAL IMPRESSION / ASSESSMENT AND PLAN / ED COURSE  Pertinent labs & imaging results that were available during my care of the patient were reviewed by me and considered in my medical decision making (see chart for details).  24 year old female with a history of diabetes on Metformin and hypertension presents with paresthesias to the left fourth and fifth digit as well as to the left lower lip which she awoke with this morning.  The patient denies any weakness, and has had no prior history of similar symptoms.  On exam she is overall very well-appearing.  Her vital signs are normal except for mild tachycardia at triage but the patient states that she was quite anxious.  Neurologic exam is normal except for subjective decreased sensation in the ulnar distribution on the left hand and to the left lower lip but not the rest of the face.  Initial lab  work-up was obtained from triage and the patient is slightly hyperglycemic with no other acute findings.  CT head was also obtained from triage and is negative.  Overall I have a very low suspicion for stroke given the patient's age and the quite localized nature of the symptoms which are sensory only and correspond entirely to the ulnar distribution in the left hand, which would favor peripheral neuropathy.  However this would not explain the tingling in the lip.  Differential also includes neuropathy related to diabetes and hyperglycemia.  ----------------------------------------- 1:16 PM on 02/12/2020 -----------------------------------------  The patient's glucose has improved.  She reports that her paresthesias have almost completely resolved in the face and have completely resolved in the arm.  I consulted Dr. Loretha Brasil from neurology who recommended  getting MRI diffusion imaging of the brain, and if this was negative stroke would be ruled out and the patient be appropriate for outpatient follow-up.  MR diffusion is negative for any acute findings.  At this time, the patient is stable for discharge home.  I counseled her on the results of the work-up.  I emphasized the importance of good glucose control, compliance with her Metformin, and advised that she follow-up with her doctor within the next 1 to 2 weeks.  Thorough return precautions given, and she expresses understanding. ____________________________________________   FINAL CLINICAL IMPRESSION(S) / ED DIAGNOSES  Final diagnoses:  Paresthesias  Hyperglycemia      NEW MEDICATIONS STARTED DURING THIS VISIT:  New Prescriptions   No medications on file     Note:  This document was prepared using Dragon voice recognition software and may include unintentional dictation errors.    Dionne Bucy, MD 02/12/20 1317

## 2020-02-12 NOTE — ED Triage Notes (Signed)
First Nurse Note:  Arrives with c/o left fourth and fifth finger and lower lip tingling.  States awoke this morning with symptoms.  Patient is AAOx3.  Skin warm and dry. MAE equally and strong.  Gait steady.  Posture upright and relaxed.  Facial movements equal. Speech clear.  NAD

## 2020-02-12 NOTE — Discharge Instructions (Addendum)
Continue to take your Metformin as prescribed.  Follow-up with your primary care doctor within the next 1 to 2 weeks to make sure that your glucose is under good control.  Your CT and MRI showed no signs of a stroke.  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  This should improve if the sugar is well controlled.  Return to the ER immediately for new, worsening, or persistent severe numbness, increasing area of numbness, any weakness or difficulty with grip, vision changes, difficulty speaking, asymmetry in her face, difficulty walking or with coordination, or any other new or worsening symptoms that concern you.

## 2020-02-12 NOTE — ED Notes (Signed)
Pt taken to CT.

## 2020-05-18 DIAGNOSIS — Z20822 Contact with and (suspected) exposure to covid-19: Secondary | ICD-10-CM | POA: Diagnosis not present

## 2020-08-31 ENCOUNTER — Other Ambulatory Visit: Payer: Self-pay

## 2020-08-31 ENCOUNTER — Emergency Department (HOSPITAL_COMMUNITY)
Admission: EM | Admit: 2020-08-31 | Discharge: 2020-09-01 | Disposition: A | Discharge status: Home / Self Care | Payer: No Typology Code available for payment source | Attending: Emergency Medicine | Admitting: Emergency Medicine

## 2020-08-31 ENCOUNTER — Encounter (HOSPITAL_COMMUNITY): Payer: Self-pay | Admitting: *Deleted

## 2020-08-31 DIAGNOSIS — E119 Type 2 diabetes mellitus without complications: Secondary | ICD-10-CM | POA: Insufficient documentation

## 2020-08-31 DIAGNOSIS — I10 Essential (primary) hypertension: Secondary | ICD-10-CM | POA: Insufficient documentation

## 2020-08-31 DIAGNOSIS — U071 COVID-19: Secondary | ICD-10-CM | POA: Insufficient documentation

## 2020-08-31 DIAGNOSIS — R059 Cough, unspecified: Secondary | ICD-10-CM | POA: Diagnosis present

## 2020-08-31 NOTE — ED Triage Notes (Signed)
Pt tested positive for COVID yesterday. She's having shortness of breath, congestion, body aches.

## 2020-09-01 MED ORDER — ALBUTEROL SULFATE HFA 108 (90 BASE) MCG/ACT IN AERS
2.0000 | INHALATION_SPRAY | Freq: Once | RESPIRATORY_TRACT | Status: AC
  Administered 2020-09-01: 2 via RESPIRATORY_TRACT
  Filled 2020-09-01: qty 6.7

## 2020-09-01 NOTE — Discharge Instructions (Signed)
Take 1000 mg Tylenol every 6-8 hours for fever, headaches, body aches.  You may use 2 puffs of an albuterol inhaler every 4-6 hours for cough, wheezing, shortness of breath.  Drink plenty of fluids to prevent dehydration. You may continue to use other over-the-counter remedies for symptom control, if desired. Return for new or concerning symptoms such as worsening shortness of breath, coughing up blood, persistent vomiting, loss of consciousness.

## 2020-09-01 NOTE — ED Notes (Signed)
Pt o2 99-100% on RA while ambulating.

## 2020-09-01 NOTE — ED Provider Notes (Signed)
[facility] DEPT Provider Note   CSN: [id number] Arrival date & time: 08/31/20  1744     History Chief Complaint  Patient presents with  . Shortness of Breath  . Cough    Rebecca Coleman is a 25 y.o. female.  25 year old female with hx of DM, HTN, anemia presents to the emergency department for evaluation of shortness of breath with nasal congestion, body aches.  She had onset of symptoms on Tuesday.  They have remained persistent.  They are typically worse in the mornings and improves as the day continues.  She has been using DayQuil and NyQuil with minimal to moderate improvement.  Was tested for COVID-19 yesterday and was positive.  States that she had some wheezing earlier in the day and her mother was concerned about her breathing status.  She was advised to come to the ED for assessment. Received 2 doses of the COVID vaccine in April.        Past Medical History:  Diagnosis Date  . Anemia   . Diabetes mellitus without complication ([place])   . History of blood transfusion   . Hypertension   . Medical history non-contributory     Patient Active Problem List   Diagnosis Date Noted  . Anemia 06/04/2014  . Menorrhagia 06/04/2014    Past Surgical History:  Procedure Laterality Date  . NO PAST SURGERIES       OB History    Gravida  0   Para      Term      Preterm      AB      Living        SAB      IAB      Ectopic      Multiple      Live Births              Family History  Problem Relation Age of Onset  . Hypertension Sister   . Diabetes Maternal Aunt   . Cancer Mother   . Mental retardation Cousin     Social History   Tobacco Use  . Smoking status: Never Smoker  . Smokeless tobacco: Never Used  Vaping Use  . Vaping Use: Never used  Substance Use Topics  . Alcohol use: Yes    Comment: rarely  . Drug use: Yes    Types: Marijuana    Home Medications Prior to Admission medications   Medication  Sig Start Date End Date Taking? Authorizing Provider  ibuprofen (ADVIL,MOTRIN) 200 MG tablet Take 400 mg by mouth daily as needed for headache (migraine).   Yes [provider]  Pseudoephedrine-APAP-DM (DAYQUIL PO) Take 1 tablet by mouth as needed.   Yes [provider]  cyclobenzaprine (FLEXERIL) 10 MG tablet Take 1 tablet (10 mg total) by mouth 2 (two) times daily as needed for muscle spasms. Patient not taking: No sig reported 08/27/17   Domenic Moras, PA-C  doxycycline (VIBRA-TABS) 100 MG tablet Take 1 tablet (100 mg total) by mouth 2 (two) times daily. Patient not taking: No sig reported 10/01/16   Robyn Haber, MD  ibuprofen (ADVIL,MOTRIN) 600 MG tablet Take 1 tablet (600 mg total) by mouth every 6 (six) hours as needed. Patient not taking: No sig reported 08/27/17   Domenic Moras, PA-C  omeprazole (PRILOSEC) 20 MG capsule Take 1 capsule (20 mg total) by mouth daily. Patient not taking: No sig reported 03/17/17   Charlann Lange, PA-C  ondansetron (ZOFRAN ODT) 8 MG  disintegrating tablet Take 1 tablet (8 mg total) by mouth every 8 (eight) hours as needed for nausea or vomiting. Patient not taking: No sig reported 12/03/16   Deatra Canter, FNP  sucralfate (CARAFATE) 1 GM/10ML suspension Take 10 mLs (1 g total) by mouth 4 (four) times daily -  with meals and at bedtime. Patient not taking: No sig reported 03/17/17   Elpidio Anis, PA-C    Allergies    Shrimp [shellfish allergy]  Review of Systems   Review of Systems  Ten systems reviewed and are negative for acute change, except as noted in the HPI.    Physical Exam Updated Vital Signs BP (!) 152/99 (BP Location: Right Arm)   Pulse (!) 102   Temp 98.4 F (36.9 C) (Oral)   Resp 20   SpO2 100%   Physical Exam Vitals and nursing note reviewed.  Constitutional:      General: She is not in acute distress.    Appearance: She is well-developed and well-nourished. She is not diaphoretic.     Comments: Nontoxic  appearing and in NAD  HENT:     Head: Normocephalic and atraumatic.     Nose: Congestion present.     Mouth/Throat:     Comments: Tolerating secretions without difficulty. No voice muffling or drooling. Eyes:     General: No scleral icterus.    Extraocular Movements: EOM normal.     Conjunctiva/sclera: Conjunctivae normal.  Cardiovascular:     Rate and Rhythm: Regular rhythm.  Pulmonary:     Effort: Pulmonary effort is normal. No respiratory distress.     Breath sounds: No wheezing, rhonchi or rales.     Comments: Lungs CTAB. Respirations even and unlabored. Musculoskeletal:        General: Normal range of motion.     Cervical back: Normal range of motion.  Skin:    General: Skin is warm and dry.     Coloration: Skin is not pale.     Findings: No erythema or rash.  Neurological:     Mental Status: She is alert and oriented to person, place, and time.     Coordination: Coordination normal.  Psychiatric:        Mood and Affect: Mood is anxious (mild).        Behavior: Behavior normal.     ED Results / Procedures / Treatments   Labs (all labs ordered are listed, but only abnormal results are displayed) Labs Reviewed - No data to display  EKG None  Radiology No results found.  Procedures Procedures (including critical care time)  Medications Ordered in ED Medications  albuterol (VENTOLIN HFA) 108 (90 Base) MCG/ACT inhaler 2 puff (2 puffs Inhalation Given 09/01/20 0020)    ED Course  I have reviewed the triage vital signs and the nursing notes.  Pertinent labs & imaging results that were available during my care of the patient were reviewed by me and considered in my medical decision making (see chart for details).  Clinical Course as of 09/01/20 0113  Sun Sep 01, 2020  0015 Per RN, patient with O2 99-100% on RA while ambulating. [KH]    Clinical Course User Index [KH] Antony Madura, PA-C   MDM Rules/Calculators/A&P                          25 year old female  who tested positive for COVID-19 yesterday presents for persistent symptoms and shortness of breath.  Despite shortness of breath  complaints, she has no signs of respiratory distress.  No tachypnea, dyspnea, hypoxia.  She is able to ambulate in the ED without acute desaturation.  Afebrile since arrival.  Counseled on continued outpatient supportive care.  Provided reassurance.  She has been encouraged to follow-up with her primary care doctor and to return if her breathing worsens.  Discharged in stable condition with no unaddressed concerns.  Rebecca Coleman was evaluated in Emergency Department on 09/01/2020 for the symptoms described in the history of present illness. She was evaluated in the context of the global COVID-19 pandemic, which necessitated consideration that the patient might be at risk for infection with the SARS-CoV-2 virus that causes COVID-19. Institutional protocols and algorithms that pertain to the evaluation of patients at risk for COVID-19 are in a state of rapid change based on information released by regulatory bodies including the CDC and federal and state organizations. These policies and algorithms were followed during the patient's care in the ED.   Final Clinical Impression(s) / ED Diagnoses Final diagnoses:  COVID-19 virus infection    Rx / DC Orders ED Discharge Orders    None       Antony Madura, PA-C 09/01/20 0114    Ward, Layla Maw, DO 09/01/20 [phone]

## 2020-09-24 IMAGING — MR MR HEAD W/O CM
4 series · 35 of 48 positions shown · non-contrast
Comparison: Head CT same day

CLINICAL DATA: Tingling the left fourth and fifth fingers and of
the left face

EXAM:
MRI HEAD WITHOUT CONTRAST
TECHNIQUE: Multiplanar, multiecho pulse sequences of the brain and surrounding
structures were obtained without intravenous contrast.

[Series 5: ax dwi_tracew · axial · 3.0mm · 0.60mm/px · z∈[-151,+2]mm · 9 of 48 slices shown]
[im 1/48]
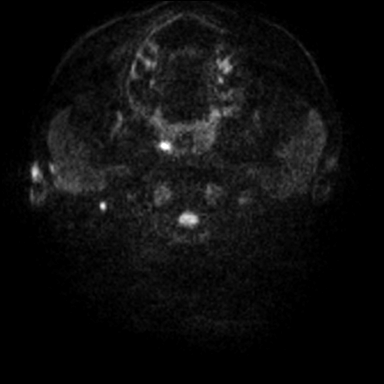
[im 8/48]
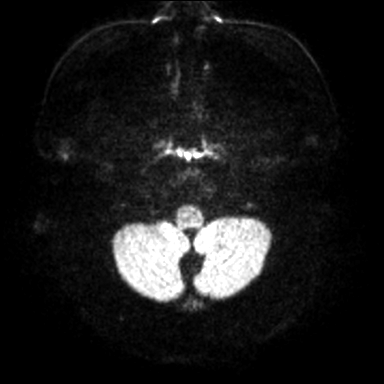
[im 16/48]
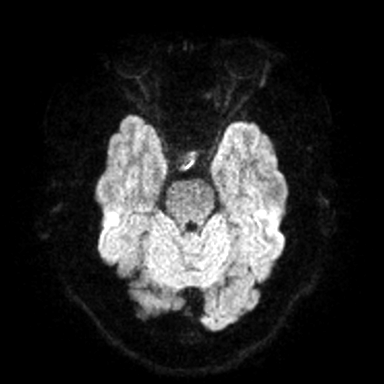
[im 20/48]
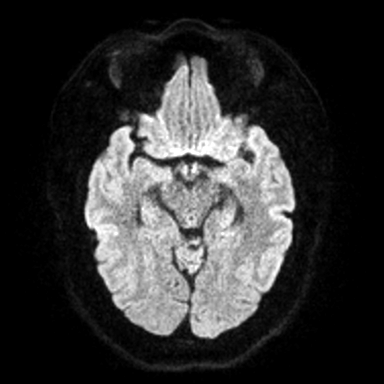
[im 24/48]
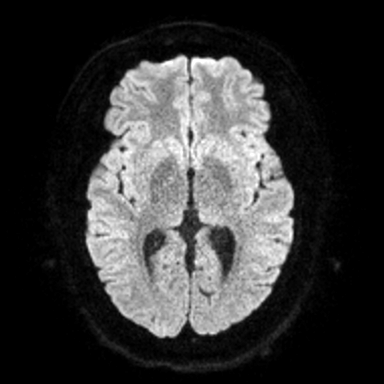
[im 28/48]
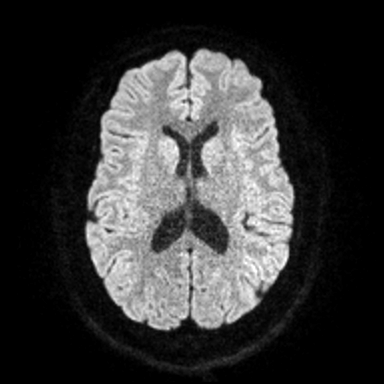
[im 32/48]
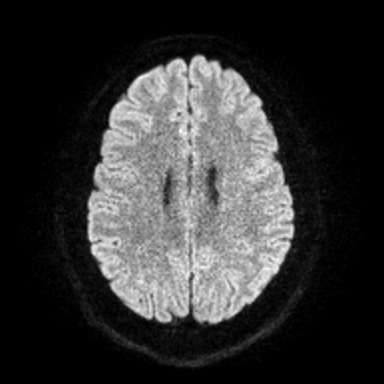
[im 40/48]
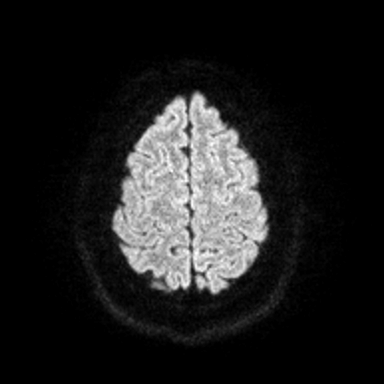
[im 48/48]
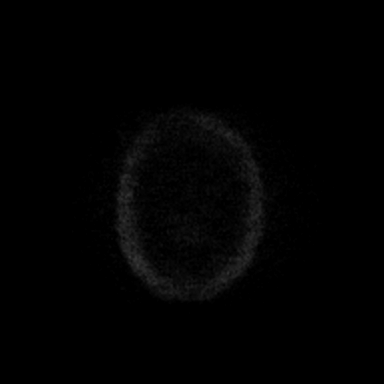

[Series 6: ax dwi_adc · axial · 3.0mm · 0.60mm/px · z∈[-151,+2]mm · 9 of 47 slices shown]
[im 1/47]
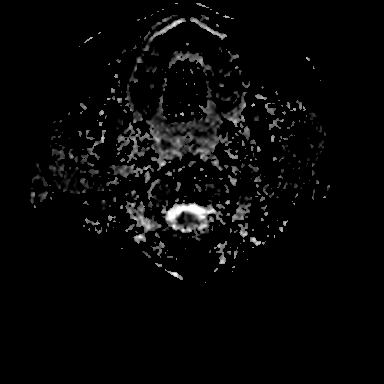
[im 8/47]
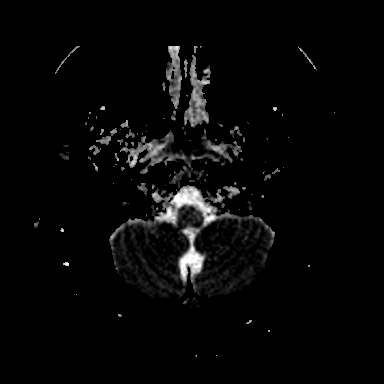
[im 16/47]
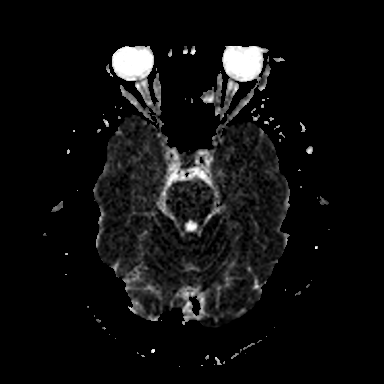
[im 20/47]
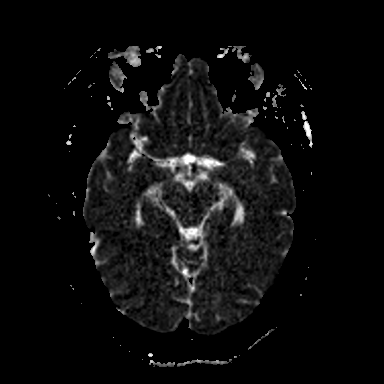
[im 24/47]
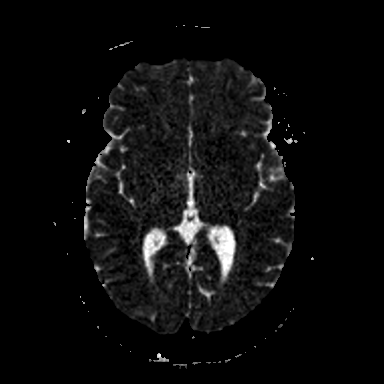
[im 27/47]
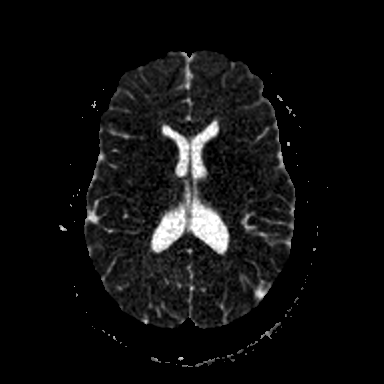
[im 31/47]
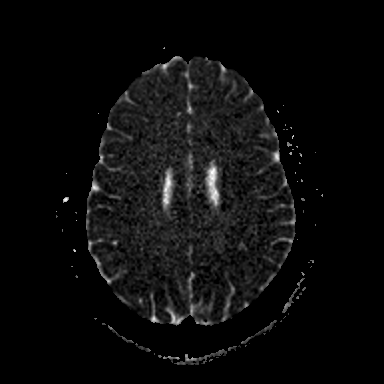
[im 39/47]
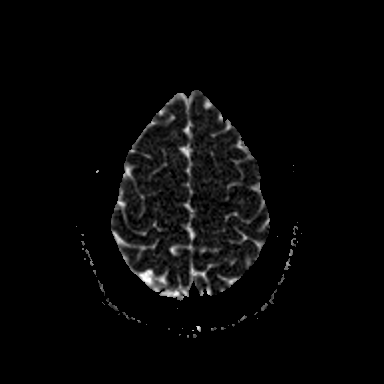
[im 47/47]
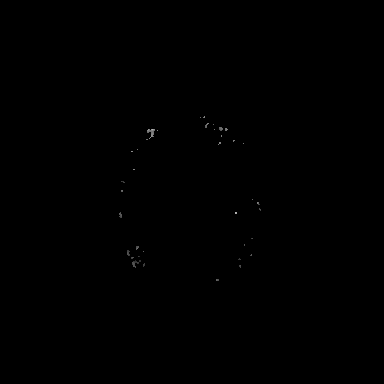

[Series 7: cor dwi_tracew · coronal · 5.0mm · 0.60mm/px · 11 of 38 slices shown]
[im 1/38]
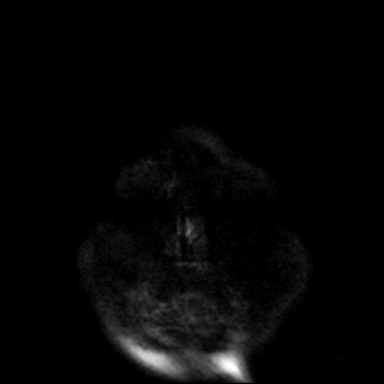
[im 4/38]
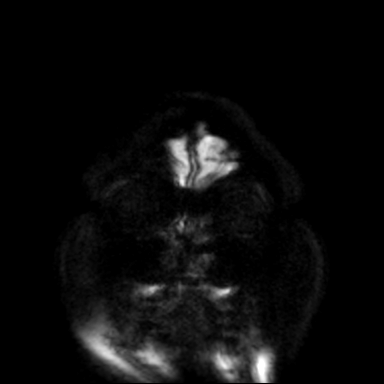
[im 8/38]
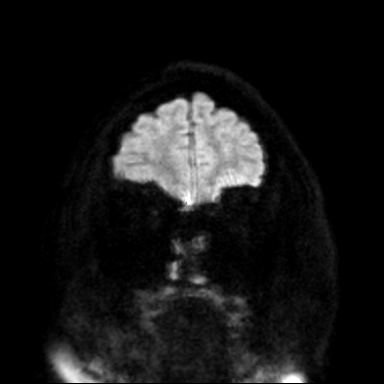
[im 12/38]
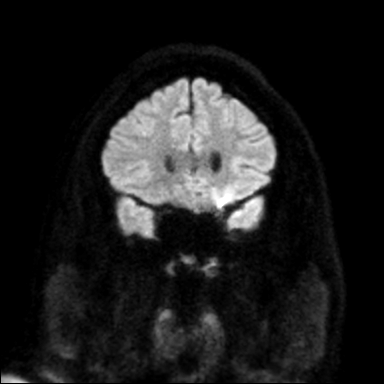
[im 15/38]
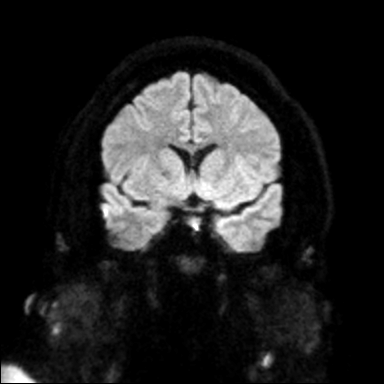
[im 19/38]
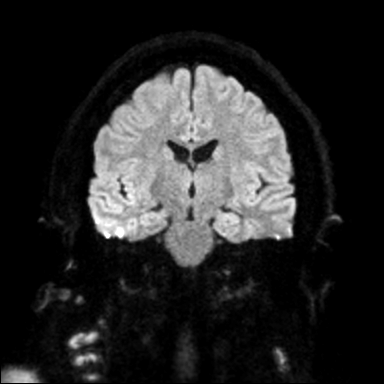
[im 23/38]
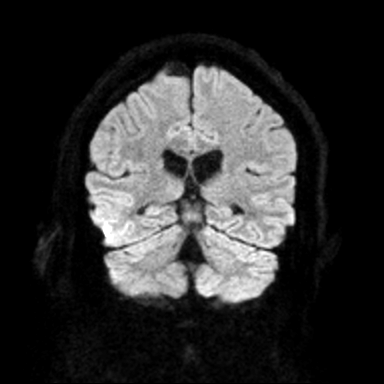
[im 26/38]
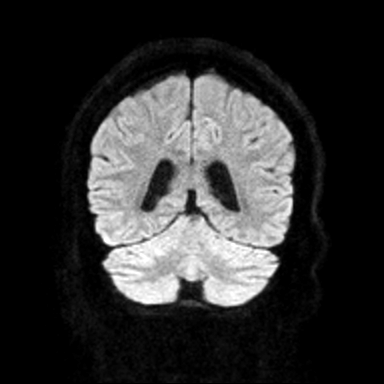
[im 30/38]
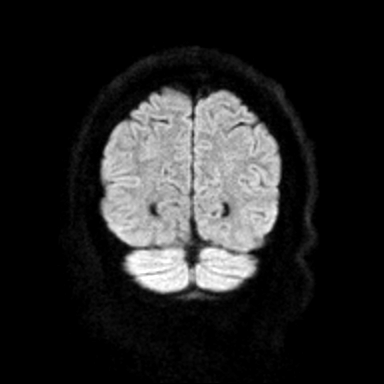
[im 34/38]
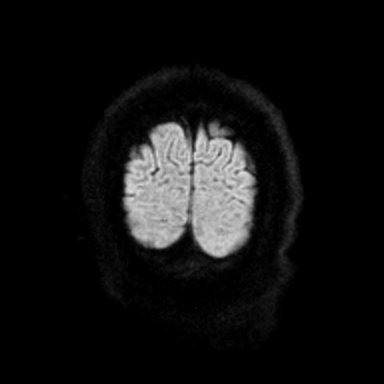
[im 38/38]
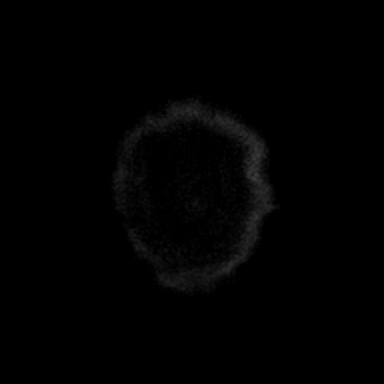

[Series 8: cor dwi_adc · coronal · 5.0mm · 0.60mm/px · 6 of 38 slices shown]
[im 1/38]
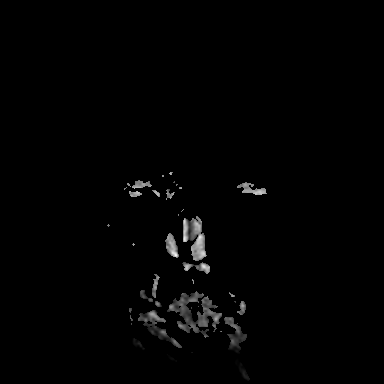
[im 4/38]
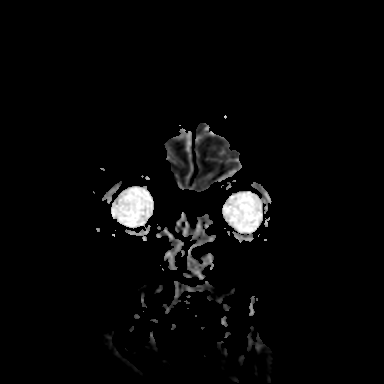
[im 8/38]
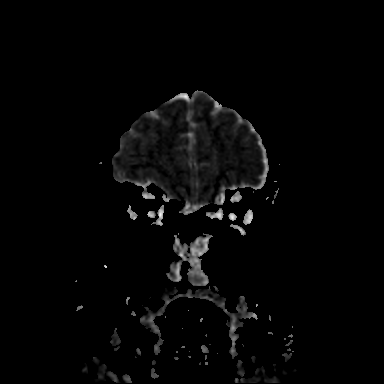
[im 12/38]
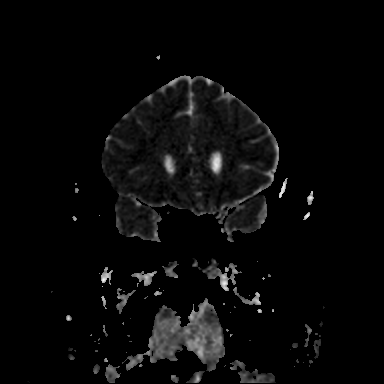
[im 19/38]
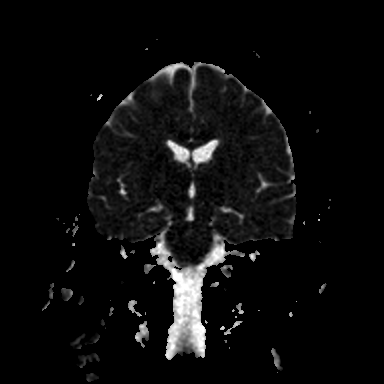
[im 34/38]
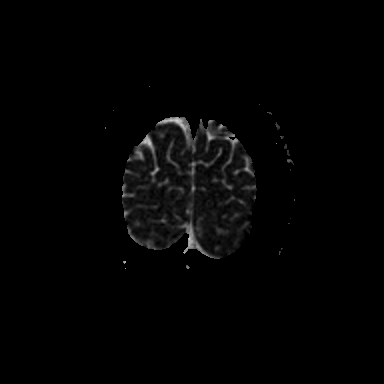

[35 of 48 positions shown; findings below may reference images not displayed]

FINDINGS: Brain: Diffusion imaging only was requested and performed. Brain has
a normal appearance on the sequences without evidence of
malformation, atrophy, old or acute infarction, mass lesion,
hemorrhage, hydrocephalus or extra-axial collection.

Vascular: No vascular data available.

Skull and upper cervical spine: No abnormality seen.

Sinuses/Orbits: No abnormality seen.

Other: None
IMPRESSION: Diffusion imaging only was requested and performed. These sequences
are normal.

## 2020-11-21 DIAGNOSIS — I10 Essential (primary) hypertension: Secondary | ICD-10-CM | POA: Diagnosis not present

## 2020-11-21 DIAGNOSIS — Z760 Encounter for issue of repeat prescription: Secondary | ICD-10-CM | POA: Diagnosis not present

## 2020-11-21 DIAGNOSIS — R600 Localized edema: Secondary | ICD-10-CM | POA: Diagnosis not present

## 2020-11-21 DIAGNOSIS — E1165 Type 2 diabetes mellitus with hyperglycemia: Secondary | ICD-10-CM | POA: Diagnosis not present

## 2021-01-02 DIAGNOSIS — Z20822 Contact with and (suspected) exposure to covid-19: Secondary | ICD-10-CM | POA: Diagnosis not present

## 2021-05-01 ENCOUNTER — Emergency Department (HOSPITAL_COMMUNITY)
Admission: EM | Admit: 2021-05-01 | Discharge: 2021-05-01 | Disposition: A | Discharge status: Home / Self Care | Payer: Self-pay | Attending: Emergency Medicine | Admitting: Emergency Medicine

## 2021-05-01 ENCOUNTER — Encounter (HOSPITAL_COMMUNITY): Payer: Self-pay | Admitting: Emergency Medicine

## 2021-05-01 ENCOUNTER — Emergency Department (HOSPITAL_COMMUNITY): Payer: Self-pay

## 2021-05-01 DIAGNOSIS — N920 Excessive and frequent menstruation with regular cycle: Secondary | ICD-10-CM | POA: Insufficient documentation

## 2021-05-01 DIAGNOSIS — I10 Essential (primary) hypertension: Secondary | ICD-10-CM | POA: Insufficient documentation

## 2021-05-01 DIAGNOSIS — E1165 Type 2 diabetes mellitus with hyperglycemia: Secondary | ICD-10-CM | POA: Insufficient documentation

## 2021-05-01 DIAGNOSIS — N921 Excessive and frequent menstruation with irregular cycle: Secondary | ICD-10-CM

## 2021-05-01 DIAGNOSIS — D500 Iron deficiency anemia secondary to blood loss (chronic): Secondary | ICD-10-CM | POA: Insufficient documentation

## 2021-05-01 DIAGNOSIS — N9489 Other specified conditions associated with female genital organs and menstrual cycle: Secondary | ICD-10-CM | POA: Insufficient documentation

## 2021-05-01 DIAGNOSIS — Z7984 Long term (current) use of oral hypoglycemic drugs: Secondary | ICD-10-CM | POA: Insufficient documentation

## 2021-05-01 DIAGNOSIS — R102 Pelvic and perineal pain: Secondary | ICD-10-CM | POA: Insufficient documentation

## 2021-05-01 DIAGNOSIS — Z79899 Other long term (current) drug therapy: Secondary | ICD-10-CM | POA: Insufficient documentation

## 2021-05-01 LAB — CBC WITH DIFFERENTIAL/PLATELET
Abs Immature Granulocytes: 0.03 10*3/uL (ref 0.00–0.07)
Basophils Absolute: 0 10*3/uL (ref 0.0–0.1)
Basophils Relative: 0 %
Eosinophils Absolute: 0 10*3/uL (ref 0.0–0.5)
Eosinophils Relative: 1 %
HCT: 34.5 % — ABNORMAL LOW (ref 36.0–46.0)
Hemoglobin: 10.8 g/dL — ABNORMAL LOW (ref 12.0–15.0)
Immature Granulocytes: 1 %
Lymphocytes Relative: 43 %
Lymphs Abs: 2.6 10*3/uL (ref 0.7–4.0)
MCH: 26.7 pg (ref 26.0–34.0)
MCHC: 31.3 g/dL (ref 30.0–36.0)
MCV: 85.2 fL (ref 80.0–100.0)
Monocytes Absolute: 0.4 10*3/uL (ref 0.1–1.0)
Monocytes Relative: 7 %
Neutro Abs: 3 10*3/uL (ref 1.7–7.7)
Neutrophils Relative %: 48 %
Platelets: 325 10*3/uL (ref 150–400)
RBC: 4.05 MIL/uL (ref 3.87–5.11)
RDW: 13.1 % (ref 11.5–15.5)
WBC: 6 10*3/uL (ref 4.0–10.5)
nRBC: 0 % (ref 0.0–0.2)

## 2021-05-01 LAB — BASIC METABOLIC PANEL
Anion gap: 8 (ref 5–15)
BUN: 9 mg/dL (ref 6–20)
CO2: 27 mmol/L (ref 22–32)
Calcium: 8.7 mg/dL — ABNORMAL LOW (ref 8.9–10.3)
Chloride: 101 mmol/L (ref 98–111)
Creatinine, Ser: 0.85 mg/dL (ref 0.44–1.00)
GFR, Estimated: >60 mL/min (ref >60)
Glucose, Bld: 362 mg/dL — ABNORMAL HIGH (ref 70–99)
Potassium: 3.8 mmol/L (ref 3.5–5.1)
Sodium: 136 mmol/L (ref 135–145)

## 2021-05-01 LAB — TYPE AND SCREEN
ABO/RH(D): O POS
Antibody Screen: NEGATIVE

## 2021-05-01 LAB — I-STAT BETA HCG BLOOD, ED (MC, WL, AP ONLY): I-stat hCG, quantitative: <5 m[IU]/mL (ref <5)

## 2021-05-01 MED ORDER — IBUPROFEN 600 MG PO TABS: 600.0000 mg | ORAL_TABLET | Freq: Three times a day (TID) | ORAL | 0 refills | Status: AC | PRN

## 2021-05-01 MED ORDER — KETOROLAC TROMETHAMINE 30 MG/ML IJ SOLN
30.0000 mg | Freq: Once | INTRAMUSCULAR | Status: AC
  Administered 2021-05-01: 30 mg via INTRAMUSCULAR
  Filled 2021-05-01: qty 1

## 2021-05-01 MED ORDER — METFORMIN HCL ER 500 MG PO TB24: 500.0000 mg | ORAL_TABLET | Freq: Every evening | ORAL | 0 refills | Status: AC

## 2021-05-01 NOTE — ED Provider Notes (Signed)
Emergency Medicine Provider Triage Evaluation Note  Rebecca Coleman , a 25 y.o. female  was evaluated in triage.  Pt complains of heavy vaginal bleeding, light headed, history of transfusions.  Review of Systems  Positive: cramping Negative: No fever or chills  Physical Exam  BP (!) 140/95 (BP Location: Left Arm)   Pulse (!) 109   Temp 98 F (36.7 C) (Oral)   Resp 18   LMP 04/26/2021   SpO2 99%  Gen:   Awake, no distress   Resp:  Normal effort  MSK:   Moves extremities without difficulty  Other:    Medical Decision Making  Medically screening exam initiated at 12:48 PM.  Appropriate orders placed.  Ceriah Coleman was informed that the remainder of the evaluation will be completed by another provider, this initial triage assessment does not replace that evaluation, and the importance of remaining in the ED until their evaluation is complete.     Rebecca Coleman 05/01/21 1249    Koleen Distance, MD 05/01/21 [phone]

## 2021-05-01 NOTE — ED Provider Notes (Signed)
Patient initially seen by Dr. Delford Field.  Please see her note.  Pt waiting on [place] result. Metformin Rx entered.  OTC iron tablets on discharge. If negative, Ok for discharge.  Ultrasound without definitive findings for torsion although somewhat limited.  Discussed findings with patient.  STable for discharge.   Rebecca Dibbles, MD 05/01/21 [name]

## 2021-05-01 NOTE — ED Notes (Signed)
Patient ambulatory to room from triage.

## 2021-05-01 NOTE — ED Provider Notes (Signed)
[facility]-EMERGENCY DEPT Provider Note   CSN: [id number] Arrival date & time: 05/01/21  1224     History Chief Complaint  Patient presents with   Vaginal Bleeding    Rebecca Coleman is a 25 y.o. female.  Rebecca Coleman has had heavy menstrual periods in the past.  She will go for a time without having any menstrual period, and then she will have a cycle.  She has been bleeding heavily for the past 5 days, and 1 differentiates this episode from her previous episodes is her complaint of lower abdominal pain.  The pain is cramping and severe.  The history is provided by the patient.  Vaginal Bleeding Quality:  Heavier than menses Severity:  Severe Onset quality:  Sudden Duration:  5 days Timing:  Constant Progression:  Unchanged Chronicity:  New Menstrual history:  Irregular Possible pregnancy: no   Context: spontaneously   Relieved by:  Nothing Worsened by:  Nothing Ineffective treatments:  None tried Associated symptoms: abdominal pain   Associated symptoms: no back pain, no dysuria and no fever       Past Medical History:  Diagnosis Date   Anemia    Diabetes mellitus without complication (HCC)    History of blood transfusion    Hypertension    Medical history non-contributory     Patient Active Problem List   Diagnosis Date Noted   Anemia 06/04/2014   Menorrhagia 06/04/2014    Past Surgical History:  Procedure Laterality Date   NO PAST SURGERIES       OB History     Gravida  0   Para      Term      Preterm      AB      Living         SAB      IAB      Ectopic      Multiple      Live Births              Family History  Problem Relation Age of Onset   Hypertension Sister    Diabetes Maternal Aunt    Cancer Mother    Mental retardation Cousin     Social History   Tobacco Use   Smoking status: Never   Smokeless tobacco: Never  Vaping Use   Vaping Use: Never used  Substance Use Topics   Alcohol  use: Yes    Comment: rarely   Drug use: Yes    Types: Marijuana    Home Medications Prior to Admission medications   Medication Sig Start Date End Date Taking? Authorizing Provider  ibuprofen (ADVIL,MOTRIN) 200 MG tablet Take 600 mg by mouth daily as needed for headache or cramping (migraine).   Yes [provider]  hydrochlorothiazide (MICROZIDE) 12.5 MG capsule Take 12.5 mg by mouth every morning. Patient not taking: No sig reported 11/21/20   [provider]  metFORMIN (GLUCOPHAGE-XR) 500 MG 24 hr tablet Take 500 mg by mouth every evening. Patient not taking: No sig reported 11/21/20   [provider]    Allergies    Shrimp [shellfish allergy]  Review of Systems   Review of Systems  Constitutional:  Negative for chills and fever.  HENT:  Negative for ear pain and sore throat.   Eyes:  Negative for pain and visual disturbance.  Respiratory:  Negative for cough and shortness of breath.   Cardiovascular:  Negative for chest pain and palpitations.  Gastrointestinal:  Positive for abdominal pain. Negative for vomiting.  Genitourinary:  Positive for vaginal bleeding. Negative for dysuria and hematuria.  Musculoskeletal:  Negative for arthralgias and back pain.  Skin:  Negative for color change and rash.  Neurological:  Negative for seizures and syncope.  All other systems reviewed and are negative.  Physical Exam Updated Vital Signs BP (!) 149/65   Pulse 98   Temp 98 F (36.7 C) (Oral)   Resp 18   LMP 04/26/2021   SpO2 99%   Physical Exam Vitals and nursing note reviewed. Exam conducted with a chaperone present.  HENT:     Head: Normocephalic and atraumatic.  Eyes:     General: No scleral icterus. Pulmonary:     Effort: Pulmonary effort is normal. No respiratory distress.  Genitourinary:    General: Normal vulva.     Vagina: Bleeding present.     Cervix: Cervical bleeding present.     Uterus: Tender.      Adnexa:        Right: Tenderness  present.        Left: Tenderness present.      Comments: Tender in entire pelvic region without laterality Musculoskeletal:        General: No deformity or signs of injury.     Cervical back: Normal range of motion.  Skin:    General: Skin is warm and dry.  Neurological:     General: No focal deficit present.     Mental Status: She is alert and oriented to person, place, and time.  Psychiatric:        Mood and Affect: Mood normal.    ED Results / Procedures / Treatments   Labs (all labs ordered are listed, but only abnormal results are displayed) Labs Reviewed  CBC WITH DIFFERENTIAL/PLATELET - Abnormal; Notable for the following components:      Result Value   Hemoglobin 10.8 (*)    HCT 34.5 (*)    All other components within normal limits  BASIC METABOLIC PANEL - Abnormal; Notable for the following components:   Glucose, Bld 362 (*)    Calcium 8.7 (*)    All other components within normal limits  I-STAT BETA HCG BLOOD, ED ([facility], [facility], [facility] ONLY)  TYPE AND SCREEN    EKG None  Radiology No results found.  Procedures Procedures   Medications Ordered in ED Medications  ketorolac (TORADOL) 30 MG/ML injection 30 mg (30 mg Intramuscular Given 05/01/21 1510)    ED Course  I have reviewed the triage vital signs and the nursing notes.  Pertinent labs & imaging results that were available during my care of the patient were reviewed by me and considered in my medical decision making (see chart for details).    MDM Rules/Calculators/A&P                           Rebecca Coleman resents with heavy vaginal bleeding.  She also has some pelvic pain.  She was noted abnormal vital signs.  Hemoglobin slightly low at 10.8 which is likely secondary to chronic blood loss.  Glucose was elevated consistent with her known diagnosis of diabetes.  She has not been on any medication for a while secondary to lack of health insurance.  I recommended that she resume her metformin, and I would  recommend that she establish care with a primary care doctor.  I have placed a TOC referral.  She was also told to start  ferrous sulfate for her anemia.  I recommend follow-up with gynecology for her vaginal bleeding. Final Clinical Impression(s) / ED Diagnoses Final diagnoses:  Pelvic pain  Hyperglycemia due to diabetes mellitus (HCC)  Iron deficiency anemia due to chronic blood loss  Menorrhagia with irregular cycle    Rx / DC Orders ED Discharge Orders          Ordered    metFORMIN (GLUCOPHAGE-XR) 500 MG 24 hr tablet  Every evening        05/01/21 1635    Consult to Transition of Care Team       Comments: Establish PCP  Provider:  (Not yet assigned)   05/01/21 1636             Koleen Distance, MD 05/01/21 1736

## 2021-05-01 NOTE — ED Triage Notes (Signed)
Patient c/o heavy vaginal bleeding with clots. On day 5 of menstrual cycle. States approximately 2 pads/hour. Irregular periods at baseline. Hx anemia. Reports fatigue. Hx transfusion.

## 2021-05-01 NOTE — Discharge Instructions (Addendum)
The side effects of iron are discussed, primarily GI in type, such as cramping, constipation, black stools. Start with low dose of ferrous sulfate 325 mgm once daily, and if tolerated, increase dose to 2-3 tabs daily.

## 2021-05-01 NOTE — ED Notes (Signed)
Pelvic cart set up at bedside  

## 2021-05-01 NOTE — TOC Progression Note (Signed)
Transition of Care [facility]) - Progression Note    Patient Details  Name: Rebecca Coleman MRN: [id number] Date of Birth: [birth year]  Transition of [facility]) CM/SW Contact  Lorri Frederick, LCSW Phone Number: 05/01/2021, 6:14 PM  Clinical Narrative:  CSW spoke with pt about need for PCP.  Pt reports she started a new job several months ago and will be eligible for insurance coverage on 9/6.  She is currently waiting for an email about the coverage and is not sure what date it will be effective.  CSW discussed calling the customer service number once she has it in order to find out who in network PCP options are.  CSW also provided contact information for Primary Care at [facility] and [facility] as options if she will be uninsured longer.  CSW encouraged pt to call ASAP as new pt appointments are sometimes scheduled several months out.  Pt verbalizes understanding and has no other questions.           Expected Discharge Plan and Services                                                 Social Determinants of Health (SDOH) Interventions    Readmission Risk Interventions No flowsheet data found.

## 2021-10-04 ENCOUNTER — Encounter (HOSPITAL_COMMUNITY): Payer: Self-pay

## 2021-10-04 ENCOUNTER — Ambulatory Visit (HOSPITAL_COMMUNITY)
Admission: EM | Admit: 2021-10-04 | Discharge: 2021-10-04 | Disposition: A | Discharge status: Home / Self Care | Payer: 59 | Attending: Urgent Care | Admitting: Urgent Care

## 2021-10-04 ENCOUNTER — Other Ambulatory Visit: Payer: Self-pay

## 2021-10-04 DIAGNOSIS — R197 Diarrhea, unspecified: Secondary | ICD-10-CM

## 2021-10-04 DIAGNOSIS — H66002 Acute suppurative otitis media without spontaneous rupture of ear drum, left ear: Secondary | ICD-10-CM

## 2021-10-04 DIAGNOSIS — B349 Viral infection, unspecified: Secondary | ICD-10-CM | POA: Diagnosis not present

## 2021-10-04 DIAGNOSIS — R112 Nausea with vomiting, unspecified: Secondary | ICD-10-CM

## 2021-10-04 MED ORDER — ONDANSETRON 4 MG PO TBDP: 4.0000 mg | ORAL_TABLET | Freq: Three times a day (TID) | ORAL | 0 refills | Status: AC | PRN

## 2021-10-04 MED ORDER — AMOXICILLIN 500 MG PO CAPS: 1000.0000 mg | ORAL_CAPSULE | Freq: Two times a day (BID) | ORAL | 0 refills | Status: AC

## 2021-10-04 NOTE — ED Triage Notes (Signed)
Pt presents with complaints of generalized abdominal pain with some diarrhea & vomiting.  Pt also complains of bilateral ear fullness since yesterday.

## 2021-10-04 NOTE — ED Provider Notes (Signed)
[place]    CSN: [id number] Arrival date & time: 10/04/21  1209      History   Chief Complaint Chief Complaint  Patient presents with   Abdominal Pain   Ear Fullness    HPI Rebecca Coleman is a 26 y.o. female.   26 year old female presents today with concerns of nausea vomiting diarrhea and left ear pain.  She states Thursday night she had [employer] for dinner, and woke up Friday morning with a slight abdominal discomfort.  She states throughout the day she developed nausea, vomiting, diarrhea.  She states the last time she had vomiting or diarrhea was very early this morning around 4 AM.  She admits that her abdominal discomfort has actually slowly resolved.  She still feels a tiny bit queasy, but feels overall significant improvement.  She has not eaten anything yet today.  Her primary concern however is left ear discomfort.  She states she "cannot hear anything".  She denies headache, nasal congestion, sore throat.  She denies any fever.  She has not taken any over-the-counter medications for symptoms.   Abdominal Pain Associated symptoms: diarrhea, nausea and vomiting   Associated symptoms: no constipation and no fever   Ear Fullness Associated symptoms include abdominal pain.   Past Medical History:  Diagnosis Date   Anemia    Diabetes mellitus without complication ([place])    History of blood transfusion    Hypertension    Medical history non-contributory     Patient Active Problem List   Diagnosis Date Noted   Anemia 06/04/2014   Menorrhagia 06/04/2014    Past Surgical History:  Procedure Laterality Date   NO PAST SURGERIES      OB History     Gravida  0   Para      Term      Preterm      AB      Living         SAB      IAB      Ectopic      Multiple      Live Births               Home Medications    Prior to Admission medications   Medication Sig Start Date End Date Taking? Authorizing Provider  amoxicillin (AMOXIL)  500 MG capsule Take 2 capsules (1,000 mg total) by mouth 2 (two) times daily for 10 days. 10/04/21 10/14/21 Yes Karolyna Bianchini L, PA  ondansetron (ZOFRAN-ODT) 4 MG disintegrating tablet Take 1 tablet (4 mg total) by mouth every 8 (eight) hours as needed for nausea or vomiting. 10/04/21  Yes Sha Amer L, PA  hydrochlorothiazide (MICROZIDE) 12.5 MG capsule Take 12.5 mg by mouth every morning. Patient not taking: No sig reported 11/21/20   [provider]  ibuprofen (ADVIL) 600 MG tablet Take 1 tablet (600 mg total) by mouth every 8 (eight) hours as needed for moderate pain, mild pain or cramping. 05/01/21   Dorie Rank, MD  metFORMIN (GLUCOPHAGE-XR) 500 MG 24 hr tablet Take 1 tablet (500 mg total) by mouth every evening. 05/01/21   Arnaldo Natal, MD    Family History Family History  Problem Relation Age of Onset   Hypertension Sister    Diabetes Maternal Aunt    Cancer Mother    Mental retardation Cousin     Social History Social History   Tobacco Use   Smoking status: Never   Smokeless tobacco: Never  Vaping Use  Vaping Use: Never used  Substance Use Topics   Alcohol use: Yes    Comment: rarely   Drug use: Yes    Types: Marijuana     Allergies   Shrimp [shellfish allergy]   Review of Systems Review of Systems  Constitutional:  Negative for fever.  HENT:  Positive for ear pain. Negative for congestion, facial swelling, rhinorrhea and sinus pain.   Gastrointestinal:  Positive for abdominal pain, diarrhea, nausea and vomiting. Negative for abdominal distention, blood in stool and constipation.  All other systems reviewed and are negative.   Physical Exam Triage Vital Signs ED Triage Vitals  Enc Vitals Group     BP 10/04/21 1340 (!) 143/79     Pulse Rate 10/04/21 1340 (!) 117     Resp 10/04/21 1340 18     Temp 10/04/21 1340 98.5 F (36.9 C)     Temp Source 10/04/21 1340 Oral     SpO2 10/04/21 1340 94 %     Weight --      Height --      Head Circumference --       Peak Flow --      Pain Score 10/04/21 1343 5     Pain Loc --      Pain Edu? --      Excl. in [place]? --    No data found.  Updated Vital Signs BP (!) 143/79 (BP Location: Left Arm)    Pulse (!) 117    Temp 98.5 F (36.9 C) (Oral)    Resp 18    LMP 09/26/2021    SpO2 94%   Visual Acuity Right Eye Distance:   Left Eye Distance:   Bilateral Distance:    Right Eye Near:   Left Eye Near:    Bilateral Near:     Physical Exam Vitals and nursing note reviewed.  Constitutional:      General: She is not in acute distress.    Appearance: She is well-developed. She is obese. She is not ill-appearing, toxic-appearing or diaphoretic.  HENT:     Head: Normocephalic and atraumatic.     Jaw: No tenderness or swelling.     Salivary Glands: Right salivary gland is not diffusely enlarged or tender. Left salivary gland is not diffusely enlarged or tender.     Right Ear: Hearing, tympanic membrane and ear canal normal. No decreased hearing noted. No middle ear effusion. There is no impacted cerumen. Tympanic membrane is not perforated, erythematous, retracted or bulging. Tympanic membrane has normal mobility.     Left Ear: Decreased hearing noted. No laceration, drainage, swelling or tenderness. A middle ear effusion is present. There is no impacted cerumen. No foreign body. No mastoid tenderness. Tympanic membrane is erythematous and bulging. Tympanic membrane is not perforated. Tympanic membrane has decreased mobility.     Nose: Nose normal. No nasal deformity, signs of injury, laceration, nasal tenderness, congestion or rhinorrhea.     Mouth/Throat:     Mouth: Mucous membranes are moist. No injury or oral lesions.     Tongue: No lesions.     Pharynx: Oropharynx is clear. No pharyngeal swelling, oropharyngeal exudate, posterior oropharyngeal erythema or uvula swelling.     Tonsils: No tonsillar exudate or tonsillar abscesses.  Eyes:     General: No scleral icterus.    Extraocular Movements:  Extraocular movements intact.     Conjunctiva/sclera: Conjunctivae normal.     Pupils: Pupils are equal, round, and reactive to light.  Cardiovascular:  Rate and Rhythm: Normal rate and regular rhythm.     Heart sounds: No murmur heard. Pulmonary:     Effort: Pulmonary effort is normal. No respiratory distress.     Breath sounds: Normal breath sounds.  Abdominal:     General: Abdomen is flat. Bowel sounds are normal. There is no distension.     Palpations: Abdomen is soft.     Tenderness: There is no abdominal tenderness.  Musculoskeletal:        General: No swelling.     Cervical back: Neck supple.  Skin:    General: Skin is warm and dry.     Capillary Refill: Capillary refill takes less than 2 seconds.  Neurological:     Mental Status: She is alert.  Psychiatric:        Mood and Affect: Mood normal.     UC Treatments / Results  Labs (all labs ordered are listed, but only abnormal results are displayed) Labs Reviewed - No data to display  EKG   Radiology No results found.  Procedures Procedures (including critical care time)  Medications Ordered in UC Medications - No data to display  Initial Impression / Assessment and Plan / UC Course  I have reviewed the triage vital signs and the nursing notes.  Pertinent labs & imaging results that were available during my care of the patient were reviewed by me and considered in my medical decision making (see chart for details).     N/V/D - improved. Pt has had no v/d for the past 12 hours. Will do zofran for PRN use once pt reintroduces solids back into her diet Viral syndrome - improving. Clinically suspect adenovirus as cause of her symptoms. Supportive care L OM - start amoxicillin. GI side effects reviewed with pt. Yogurt, probiotics, zofran may be beneficial to help with tolerability of abx. F/U with PCP in 10-14 days to ensure complete resolution.  Final Clinical Impressions(s) / UC Diagnoses   Final  diagnoses:  Nausea vomiting and diarrhea  Acute viral syndrome  Non-recurrent acute suppurative otitis media of left ear without spontaneous rupture of tympanic membrane     Discharge Instructions      Your GI symptoms are likely related to a virus. Eat bland foods such as bananas, rice, apples, toast, saltine crackers.  Introduce fluids slowly, water, Pedialyte, Gatorade.  Avoid coffee, soda, tea, alcohol. You may use the Zofran as needed for nausea and vomiting.  You will place this under your tongue, do not chew or swallow the tablet. Please start the antibiotic and take twice daily until gone.  Do not stop early just because you feel better. Please follow-up with your primary care doctor in 14 days to ensure it is completely resolved.     ED Prescriptions     Medication Sig Dispense Auth. Provider   ondansetron (ZOFRAN-ODT) 4 MG disintegrating tablet Take 1 tablet (4 mg total) by mouth every 8 (eight) hours as needed for nausea or vomiting. 20 tablet Skyllar Notarianni L, PA   amoxicillin (AMOXIL) 500 MG capsule Take 2 capsules (1,000 mg total) by mouth 2 (two) times daily for 10 days. 40 capsule Shanaiya Bene L, PA      PDMP not reviewed this encounter.   Chaney Malling, [place] 10/04/21 1429

## 2021-10-04 NOTE — Discharge Instructions (Signed)
Your GI symptoms are likely related to a virus. Eat bland foods such as bananas, rice, apples, toast, saltine crackers.  Introduce fluids slowly, water, Pedialyte, Gatorade.  Avoid coffee, soda, tea, alcohol. You may use the Zofran as needed for nausea and vomiting.  You will place this under your tongue, do not chew or swallow the tablet. Please start the antibiotic and take twice daily until gone.  Do not stop early just because you feel better. Please follow-up with your primary care doctor in 14 days to ensure it is completely resolved.
